# Patient Record
Sex: Female | Born: 1980 | Race: White | Hispanic: No | Smoking: Former smoker
Health system: Southern US, Community
[De-identification: ages and names within clinical notes are randomized; demographics above are authoritative.]

## PROBLEM LIST (undated history)

## (undated) ENCOUNTER — Inpatient Hospital Stay (HOSPITAL_COMMUNITY): Payer: Self-pay

## (undated) DIAGNOSIS — K219 Gastro-esophageal reflux disease without esophagitis: Secondary | ICD-10-CM

## (undated) DIAGNOSIS — Z87891 Personal history of nicotine dependence: Secondary | ICD-10-CM

---
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
## 2004-03-20 ENCOUNTER — Other Ambulatory Visit: Admission: RE | Admit: 2004-03-20 | Discharge: 2004-03-20 | Payer: Self-pay | Admitting: Obstetrics and Gynecology

## 2005-04-04 ENCOUNTER — Other Ambulatory Visit: Admission: RE | Admit: 2005-04-04 | Discharge: 2005-04-04 | Payer: Self-pay | Admitting: Obstetrics and Gynecology

## 2015-02-09 LAB — OB RESULTS CONSOLE GC/CHLAMYDIA
CHLAMYDIA, DNA PROBE: NEGATIVE
Gonorrhea: NEGATIVE

## 2015-02-09 LAB — OB RESULTS CONSOLE ABO/RH: RH Type: POSITIVE

## 2015-02-09 LAB — OB RESULTS CONSOLE HEPATITIS B SURFACE ANTIGEN: Hepatitis B Surface Ag: NEGATIVE

## 2015-02-09 LAB — OB RESULTS CONSOLE HIV ANTIBODY (ROUTINE TESTING): HIV: NONREACTIVE

## 2015-02-09 LAB — OB RESULTS CONSOLE RPR: RPR: NONREACTIVE

## 2015-02-09 LAB — OB RESULTS CONSOLE ANTIBODY SCREEN: Antibody Screen: NEGATIVE

## 2015-02-09 LAB — OB RESULTS CONSOLE RUBELLA ANTIBODY, IGM: Rubella: IMMUNE

## 2015-07-28 ENCOUNTER — Other Ambulatory Visit: Payer: Self-pay | Admitting: Obstetrics and Gynecology

## 2015-07-29 NOTE — Patient Instructions (Addendum)
Your procedure is scheduled on:  Tuesday, August 02, 2015  Enter through the Main Entrance of [facility] at: 6:00 am   Pick up the phone at the desk and dial [phone].  Call this number if you have problems the morning of surgery: [phone].  Remember: Do NOT eat food: AFTER MIDNIGHT TONIGHT Do NOT drink clear liquids after: after midnight tonight  Take these medicines the morning of surgery with a SIP OF WATER: PEPCID  Do NOT wear jewelry (body piercing), metal hair clips/bobby pins, or nail polish. Do NOT wear lotions, powders, or perfumes.  You may wear deoderant. Do NOT shave for 48 hours prior to surgery. Do NOT bring valuables to the hospital. Leave suitcase in car.  After surgery it may be brought to your room.  For patients admitted to the hospital, checkout time is 11:00 AM the day of discharge.

## 2015-08-01 ENCOUNTER — Encounter (HOSPITAL_COMMUNITY)
Admission: RE | Admit: 2015-08-01 | Discharge: 2015-08-01 | Disposition: A | Discharge status: Home / Self Care | Payer: BC Managed Care – PPO | Source: Ambulatory Visit | Attending: Obstetrics and Gynecology | Admitting: Obstetrics and Gynecology

## 2015-08-01 ENCOUNTER — Encounter (HOSPITAL_COMMUNITY): Payer: Self-pay

## 2015-08-01 HISTORY — DX: Gastro-esophageal reflux disease without esophagitis: K21.9

## 2015-08-01 LAB — CBC
HEMATOCRIT: 32.6 % — AB (ref 36.0–46.0)
Hemoglobin: 10.7 g/dL — ABNORMAL LOW (ref 12.0–15.0)
MCH: 30.1 pg (ref 26.0–34.0)
MCHC: 32.8 g/dL (ref 30.0–36.0)
MCV: 91.6 fL (ref 78.0–100.0)
PLATELETS: 150 10*3/uL (ref 150–400)
RBC: 3.56 MIL/uL — AB (ref 3.87–5.11)
RDW: 14.4 % (ref 11.5–15.5)
WBC: 9.4 10*3/uL (ref 4.0–10.5)

## 2015-08-01 LAB — ABO/RH: ABO/RH(D): O POS

## 2015-08-01 LAB — TYPE AND SCREEN
ABO/RH(D): O POS
Antibody Screen: NEGATIVE

## 2015-08-01 MED ORDER — DEXTROSE 5 % IV SOLN
3.0000 g | INTRAVENOUS | Status: AC
  Administered 2015-08-02: 3 g via INTRAVENOUS
  Filled 2015-08-01: qty 3000

## 2015-08-01 NOTE — Anesthesia Preprocedure Evaluation (Addendum)
Anesthesia Evaluation  Patient identified by MRN, date of birth, ID band Patient awake    Reviewed: Allergy & Precautions, NPO status , Patient's Chart, lab work & pertinent test results  History of Anesthesia Complications Negative for: history of anesthetic complications  Airway Mallampati: III  TM Distance: >3 FB Neck ROM: Full    Dental no notable dental hx. (+) Dental Advisory Given   Pulmonary former smoker,    Pulmonary exam normal breath sounds clear to auscultation       Cardiovascular negative cardio ROS Normal cardiovascular exam Rhythm:Regular Rate:Normal     Neuro/Psych negative neurological ROS  negative psych ROS   GI/Hepatic Neg liver ROS, GERD  Medicated and Controlled,  Endo/Other  Morbid obesity  Renal/GU negative Renal ROS  negative genitourinary   Musculoskeletal negative musculoskeletal ROS (+)   Abdominal (+) + obese,   Peds negative pediatric ROS (+)  Hematology negative hematology ROS (+)   Anesthesia Other Findings   Reproductive/Obstetrics (+) Pregnancy Multiple gestation                            Anesthesia Physical Anesthesia Plan  ASA: III  Anesthesia Plan: Combined Spinal and Epidural   Post-op Pain Management:    Induction:   Airway Management Planned:   Additional Equipment:   Intra-op Plan:   Post-operative Plan:   Informed Consent: I have reviewed the patients History and Physical, chart, labs and discussed the procedure including the risks, benefits and alternatives for the proposed anesthesia with the patient or authorized representative who has indicated his/her understanding and acceptance.   Dental advisory given  Plan Discussed with: CRNA  Anesthesia Plan Comments:        Anesthesia Quick Evaluation

## 2015-08-01 NOTE — Progress Notes (Signed)
34 y.o.  [redacted]w[redacted]d    G1P0 comes in for a cesarean section at term for malpresentation of twins.  Patient has good fetal movement x2 and no bleeding.   Past Medical History  Diagnosis Date  . GERD (gastroesophageal reflux disease)     due to pregnancy    No past surgical history on file.  OB History  Gravida Para Term Preterm AB SAB TAB Ectopic Multiple Living  1             # Outcome Date GA Lbr Len/2nd Weight Sex Delivery Anes PTL Lv  1 Current               Social History   Social History  . Marital Status: Married    Spouse Name: N/A  . Number of Children: N/A  . Years of Education: N/A   Occupational History  . Not on file.   Social History Main Topics  . Smoking status: Former Smoker -- 0.25 packs/day    Types: Cigarettes  . Smokeless tobacco: Never Used  . Alcohol Use: No  . Drug Use: No  . Sexual Activity: Not on file   Other Topics Concern  . Not on file   Social History Narrative  . No narrative on file   Review of patient's allergies indicates no known allergies.   Prenatal Course: uncomplicated   Prenatal Transfer Tool  Maternal Diabetes: No Genetic Screening: Normal Maternal Ultrasounds/Referrals: Normal Fetal Ultrasounds or other Referrals:  None Maternal Substance Abuse:  No Significant Maternal Medications:  None Significant Maternal Lab Results: None   Filed Vitals:   08/02/15 0620  BP: 147/94  Pulse: 117  Temp: 98.8 F (37.1 C)  TempSrc: Oral  SpO2: 99%    Lungs/Cor:  NAD Abdomen:  soft, gravid Ex:  no cords, erythema SVE:  NA FHTs:  Present x 2  [place] bedside confirms Br/Tr presentation.  A/P   For cesarean section at term for malpresentation of term.  All risks, benefits and alternatives discussed with patient and she desires to proceed.  Avrum Kimball A

## 2015-08-02 ENCOUNTER — Encounter (HOSPITAL_COMMUNITY): Payer: Self-pay | Admitting: *Deleted

## 2015-08-02 ENCOUNTER — Encounter (HOSPITAL_COMMUNITY)
Admission: RE | Disposition: A | Discharge status: Home / Self Care | Payer: Self-pay | Source: Ambulatory Visit | Attending: Obstetrics and Gynecology

## 2015-08-02 ENCOUNTER — Inpatient Hospital Stay (HOSPITAL_COMMUNITY): Payer: BC Managed Care – PPO | Admitting: Anesthesiology

## 2015-08-02 ENCOUNTER — Inpatient Hospital Stay (HOSPITAL_COMMUNITY)
Admission: RE | Admit: 2015-08-02 | Discharge: 2015-08-05 | DRG: 765 | Disposition: A | Discharge status: Home / Self Care | Payer: BC Managed Care – PPO | Source: Ambulatory Visit | Attending: Obstetrics and Gynecology | Admitting: Obstetrics and Gynecology

## 2015-08-02 DIAGNOSIS — O30043 Twin pregnancy, dichorionic/diamniotic, third trimester: Secondary | ICD-10-CM | POA: Diagnosis present

## 2015-08-02 DIAGNOSIS — O321XX1 Maternal care for breech presentation, fetus 1: Secondary | ICD-10-CM | POA: Diagnosis present

## 2015-08-02 DIAGNOSIS — O9081 Anemia of the puerperium: Secondary | ICD-10-CM | POA: Diagnosis not present

## 2015-08-02 DIAGNOSIS — Z9889 Other specified postprocedural states: Secondary | ICD-10-CM

## 2015-08-02 DIAGNOSIS — D62 Acute posthemorrhagic anemia: Secondary | ICD-10-CM | POA: Diagnosis not present

## 2015-08-02 DIAGNOSIS — Z3A37 37 weeks gestation of pregnancy: Secondary | ICD-10-CM

## 2015-08-02 DIAGNOSIS — Z3403 Encounter for supervision of normal first pregnancy, third trimester: Secondary | ICD-10-CM | POA: Diagnosis present

## 2015-08-02 HISTORY — DX: Personal history of nicotine dependence: Z87.891

## 2015-08-02 LAB — RPR: RPR Ser Ql: NONREACTIVE

## 2015-08-02 SURGERY — Surgical Case
Anesthesia: Choice

## 2015-08-02 MED ORDER — KETOROLAC TROMETHAMINE 30 MG/ML IJ SOLN: 30.0000 mg | Freq: Four times a day (QID) | INTRAMUSCULAR | Status: AC | PRN

## 2015-08-02 MED ORDER — FENTANYL CITRATE (PF) 100 MCG/2ML IJ SOLN
25.0000 ug | INTRAMUSCULAR | Status: DC | PRN
  Administered 2015-08-02 (×2): 50 ug via INTRAVENOUS

## 2015-08-02 MED ORDER — LANOLIN HYDROUS EX OINT: 1.0000 "application " | TOPICAL_OINTMENT | CUTANEOUS | Status: DC | PRN

## 2015-08-02 MED ORDER — FLEET ENEMA 7-19 GM/118ML RE ENEM: 1.0000 | ENEMA | Freq: Every day | RECTAL | Status: DC | PRN

## 2015-08-02 MED ORDER — OXYTOCIN 10 UNIT/ML IJ SOLN
INTRAMUSCULAR | Status: AC
  Filled 2015-08-02: qty 4

## 2015-08-02 MED ORDER — DIBUCAINE 1 % RE OINT: 1.0000 "application " | TOPICAL_OINTMENT | RECTAL | Status: DC | PRN

## 2015-08-02 MED ORDER — DIPHENHYDRAMINE HCL 50 MG/ML IJ SOLN: 12.5000 mg | INTRAMUSCULAR | Status: DC | PRN

## 2015-08-02 MED ORDER — ONDANSETRON HCL 4 MG/2ML IJ SOLN
INTRAMUSCULAR | Status: DC | PRN
  Administered 2015-08-02: 4 mg via INTRAVENOUS

## 2015-08-02 MED ORDER — NALBUPHINE HCL 10 MG/ML IJ SOLN: 5.0000 mg | INTRAMUSCULAR | Status: DC | PRN

## 2015-08-02 MED ORDER — BUPIVACAINE IN DEXTROSE 0.75-8.25 % IT SOLN
INTRATHECAL | Status: AC
Start: 2015-08-02 — End: 2015-08-02
  Filled 2015-08-02: qty 2

## 2015-08-02 MED ORDER — OXYTOCIN 10 UNIT/ML IJ SOLN
40.0000 [IU] | INTRAVENOUS | Status: DC | PRN
  Administered 2015-08-02: 40 [IU] via INTRAVENOUS

## 2015-08-02 MED ORDER — BUPIVACAINE IN DEXTROSE 0.75-8.25 % IT SOLN
INTRATHECAL | Status: DC | PRN
  Administered 2015-08-02: 1.6 mL via INTRATHECAL

## 2015-08-02 MED ORDER — FENTANYL CITRATE (PF) 100 MCG/2ML IJ SOLN
INTRAMUSCULAR | Status: AC
  Filled 2015-08-02: qty 4

## 2015-08-02 MED ORDER — MORPHINE SULFATE (PF) 0.5 MG/ML IJ SOLN
INTRAMUSCULAR | Status: DC | PRN
  Administered 2015-08-02: .2 mg via EPIDURAL

## 2015-08-02 MED ORDER — OXYCODONE-ACETAMINOPHEN 5-325 MG PO TABS
2.0000 | ORAL_TABLET | ORAL | Status: DC | PRN
  Administered 2015-08-03 – 2015-08-05 (×9): 2 via ORAL
  Filled 2015-08-02 (×8): qty 2

## 2015-08-02 MED ORDER — METHYLERGONOVINE MALEATE 0.2 MG PO TABS: 0.2000 mg | ORAL_TABLET | ORAL | Status: DC | PRN

## 2015-08-02 MED ORDER — IBUPROFEN 600 MG PO TABS
600.0000 mg | ORAL_TABLET | Freq: Four times a day (QID) | ORAL | Status: DC
  Administered 2015-08-02 – 2015-08-05 (×10): 600 mg via ORAL
  Filled 2015-08-02 (×11): qty 1

## 2015-08-02 MED ORDER — KETOROLAC TROMETHAMINE 30 MG/ML IJ SOLN
30.0000 mg | Freq: Four times a day (QID) | INTRAMUSCULAR | Status: AC | PRN
  Administered 2015-08-02 (×2): 30 mg via INTRAVENOUS
  Filled 2015-08-02 (×2): qty 1

## 2015-08-02 MED ORDER — FENTANYL CITRATE (PF) 100 MCG/2ML IJ SOLN
INTRAMUSCULAR | Status: AC
  Filled 2015-08-02: qty 2

## 2015-08-02 MED ORDER — FAMOTIDINE 20 MG PO TABS
20.0000 mg | ORAL_TABLET | Freq: Two times a day (BID) | ORAL | Status: DC
  Administered 2015-08-05: 20 mg via ORAL
  Filled 2015-08-02 (×4): qty 1

## 2015-08-02 MED ORDER — MEPERIDINE HCL 25 MG/ML IJ SOLN: 6.2500 mg | INTRAMUSCULAR | Status: DC | PRN

## 2015-08-02 MED ORDER — DIPHENHYDRAMINE HCL 25 MG PO CAPS: 25.0000 mg | ORAL_CAPSULE | ORAL | Status: DC | PRN

## 2015-08-02 MED ORDER — CEFAZOLIN SODIUM-DEXTROSE 2-3 GM-% IV SOLR
INTRAVENOUS | Status: AC
  Filled 2015-08-02: qty 50

## 2015-08-02 MED ORDER — DIPHENHYDRAMINE HCL 25 MG PO CAPS: 25.0000 mg | ORAL_CAPSULE | Freq: Four times a day (QID) | ORAL | Status: DC | PRN

## 2015-08-02 MED ORDER — MORPHINE SULFATE (PF) 0.5 MG/ML IJ SOLN
INTRAMUSCULAR | Status: AC
  Filled 2015-08-02: qty 100

## 2015-08-02 MED ORDER — SENNOSIDES-DOCUSATE SODIUM 8.6-50 MG PO TABS
2.0000 | ORAL_TABLET | ORAL | Status: DC
  Administered 2015-08-02 – 2015-08-05 (×3): 2 via ORAL
  Filled 2015-08-02 (×3): qty 2

## 2015-08-02 MED ORDER — NALOXONE HCL 0.4 MG/ML IJ SOLN: 0.4000 mg | INTRAMUSCULAR | Status: DC | PRN

## 2015-08-02 MED ORDER — SODIUM CHLORIDE 0.9 % IJ SOLN: 3.0000 mL | INTRAMUSCULAR | Status: DC | PRN

## 2015-08-02 MED ORDER — SODIUM CHLORIDE 0.9 % IR SOLN
Status: DC | PRN
  Administered 2015-08-02: 1000 mL

## 2015-08-02 MED ORDER — NALOXONE HCL 1 MG/ML IJ SOLN
1.0000 ug/kg/h | INTRAVENOUS | Status: DC | PRN
  Filled 2015-08-02: qty 2

## 2015-08-02 MED ORDER — BISACODYL 10 MG RE SUPP: 10.0000 mg | Freq: Every day | RECTAL | Status: DC | PRN

## 2015-08-02 MED ORDER — LACTATED RINGERS IV SOLN
INTRAVENOUS | Status: DC | PRN
  Administered 2015-08-02: 08:00:00 via INTRAVENOUS

## 2015-08-02 MED ORDER — SCOPOLAMINE 1 MG/3DAYS TD PT72
MEDICATED_PATCH | TRANSDERMAL | Status: AC
  Administered 2015-08-02: 1.5 mg via TRANSDERMAL
  Filled 2015-08-02: qty 1

## 2015-08-02 MED ORDER — INFLUENZA VAC SPLIT QUAD 0.5 ML IM SUSY
0.5000 mL | PREFILLED_SYRINGE | INTRAMUSCULAR | Status: DC
  Filled 2015-08-02: qty 0.5

## 2015-08-02 MED ORDER — LACTATED RINGERS IV SOLN
INTRAVENOUS | Status: DC
  Administered 2015-08-02: 18:00:00 via INTRAVENOUS

## 2015-08-02 MED ORDER — WITCH HAZEL-GLYCERIN EX PADS: 1.0000 "application " | MEDICATED_PAD | CUTANEOUS | Status: DC | PRN

## 2015-08-02 MED ORDER — ONDANSETRON HCL 4 MG/2ML IJ SOLN
INTRAMUSCULAR | Status: AC
  Filled 2015-08-02: qty 2

## 2015-08-02 MED ORDER — PHENYLEPHRINE 8 MG IN D5W 100 ML (0.08MG/ML) PREMIX OPTIME
INJECTION | INTRAVENOUS | Status: DC | PRN
  Administered 2015-08-02: 60 ug/min via INTRAVENOUS

## 2015-08-02 MED ORDER — PHENYLEPHRINE 8 MG IN D5W 100 ML (0.08MG/ML) PREMIX OPTIME
INJECTION | INTRAVENOUS | Status: AC
  Filled 2015-08-02: qty 100

## 2015-08-02 MED ORDER — ONDANSETRON HCL 4 MG/2ML IJ SOLN: 4.0000 mg | Freq: Three times a day (TID) | INTRAMUSCULAR | Status: DC | PRN

## 2015-08-02 MED ORDER — ONDANSETRON HCL 4 MG/2ML IJ SOLN: 4.0000 mg | Freq: Once | INTRAMUSCULAR | Status: DC | PRN

## 2015-08-02 MED ORDER — NALBUPHINE HCL 10 MG/ML IJ SOLN: 5.0000 mg | Freq: Once | INTRAMUSCULAR | Status: DC | PRN

## 2015-08-02 MED ORDER — FENTANYL CITRATE (PF) 100 MCG/2ML IJ SOLN
INTRAMUSCULAR | Status: DC | PRN
  Administered 2015-08-02: 10 ug via INTRAVENOUS

## 2015-08-02 MED ORDER — PRENATAL MULTIVITAMIN CH
1.0000 | ORAL_TABLET | Freq: Every day | ORAL | Status: DC
  Administered 2015-08-03 – 2015-08-04 (×2): 1 via ORAL
  Filled 2015-08-02 (×2): qty 1

## 2015-08-02 MED ORDER — ZOLPIDEM TARTRATE 5 MG PO TABS: 5.0000 mg | ORAL_TABLET | Freq: Every evening | ORAL | Status: DC | PRN

## 2015-08-02 MED ORDER — FERROUS SULFATE 325 (65 FE) MG PO TABS
325.0000 mg | ORAL_TABLET | Freq: Two times a day (BID) | ORAL | Status: DC
  Administered 2015-08-03 – 2015-08-05 (×5): 325 mg via ORAL
  Filled 2015-08-02 (×5): qty 1

## 2015-08-02 MED ORDER — ACETAMINOPHEN 325 MG PO TABS
650.0000 mg | ORAL_TABLET | ORAL | Status: DC | PRN
  Administered 2015-08-02: 650 mg via ORAL
  Filled 2015-08-02: qty 2

## 2015-08-02 MED ORDER — OXYTOCIN 40 UNITS IN LACTATED RINGERS INFUSION - SIMPLE MED: 62.5000 mL/h | INTRAVENOUS | Status: AC

## 2015-08-02 MED ORDER — SCOPOLAMINE 1 MG/3DAYS TD PT72
1.0000 | MEDICATED_PATCH | Freq: Once | TRANSDERMAL | Status: DC
Start: 2015-08-02 — End: 2015-08-02
  Administered 2015-08-02: 1.5 mg via TRANSDERMAL

## 2015-08-02 MED ORDER — SCOPOLAMINE 1 MG/3DAYS TD PT72
1.0000 | MEDICATED_PATCH | Freq: Once | TRANSDERMAL | Status: DC
  Filled 2015-08-02: qty 1

## 2015-08-02 MED ORDER — MENTHOL 3 MG MT LOZG: 1.0000 | LOZENGE | OROMUCOSAL | Status: DC | PRN

## 2015-08-02 MED ORDER — SIMETHICONE 80 MG PO CHEW: 80.0000 mg | CHEWABLE_TABLET | ORAL | Status: DC | PRN

## 2015-08-02 MED ORDER — SIMETHICONE 80 MG PO CHEW
80.0000 mg | CHEWABLE_TABLET | Freq: Three times a day (TID) | ORAL | Status: DC
  Administered 2015-08-02 – 2015-08-05 (×8): 80 mg via ORAL
  Filled 2015-08-02 (×9): qty 1

## 2015-08-02 MED ORDER — SIMETHICONE 80 MG PO CHEW
80.0000 mg | CHEWABLE_TABLET | ORAL | Status: DC
  Administered 2015-08-02 – 2015-08-05 (×3): 80 mg via ORAL
  Filled 2015-08-02 (×3): qty 1

## 2015-08-02 MED ORDER — METHYLERGONOVINE MALEATE 0.2 MG/ML IJ SOLN: 0.2000 mg | INTRAMUSCULAR | Status: DC | PRN

## 2015-08-02 MED ORDER — TETANUS-DIPHTH-ACELL PERTUSSIS 5-2.5-18.5 LF-MCG/0.5 IM SUSP: 0.5000 mL | Freq: Once | INTRAMUSCULAR | Status: DC

## 2015-08-02 MED ORDER — MEASLES, MUMPS & RUBELLA VAC ~~LOC~~ INJ
0.5000 mL | INJECTION | Freq: Once | SUBCUTANEOUS | Status: DC
  Filled 2015-08-02: qty 0.5

## 2015-08-02 MED ORDER — LACTATED RINGERS IV SOLN
INTRAVENOUS | Status: DC
  Administered 2015-08-02: 1000 mL/h via INTRAVENOUS
  Administered 2015-08-02 (×2): via INTRAVENOUS

## 2015-08-02 MED ORDER — OXYCODONE-ACETAMINOPHEN 5-325 MG PO TABS
1.0000 | ORAL_TABLET | ORAL | Status: DC | PRN
  Administered 2015-08-03 (×2): 1 via ORAL
  Filled 2015-08-02 (×4): qty 1

## 2015-08-02 SURGICAL SUPPLY — 42 items
APL SKNCLS STERI-STRIP NONHPOA (GAUZE/BANDAGES/DRESSINGS) ×1
BENZOIN TINCTURE PRP APPL 2/3 (GAUZE/BANDAGES/DRESSINGS) ×3 IMPLANT
CLAMP CORD UMBIL (MISCELLANEOUS) IMPLANT
CLOSURE WOUND 1/2 X4 (GAUZE/BANDAGES/DRESSINGS) ×1
CLOTH BEACON ORANGE TIMEOUT ST (SAFETY) ×3 IMPLANT
DRAPE SHEET LG 3/4 BI-LAMINATE (DRAPES) IMPLANT
DRSG OPSITE POSTOP 4X10 (GAUZE/BANDAGES/DRESSINGS) ×3 IMPLANT
DRSG PAD ABDOMINAL 8X10 ST (GAUZE/BANDAGES/DRESSINGS) ×2 IMPLANT
DURAPREP 26ML APPLICATOR (WOUND CARE) ×3 IMPLANT
ELECT REM PT RETURN 9FT ADLT (ELECTROSURGICAL) ×3
ELECTRODE REM PT RTRN 9FT ADLT (ELECTROSURGICAL) ×1 IMPLANT
EXTRACTOR VACUUM BELL STYLE (SUCTIONS) IMPLANT
GLOVE BIO SURGEON STRL SZ7 (GLOVE) ×3 IMPLANT
GOWN STRL REUS W/TWL LRG LVL3 (GOWN DISPOSABLE) ×6 IMPLANT
KIT ABG SYR 3ML LUER SLIP (SYRINGE) IMPLANT
NDL HYPO 18GX1.5 BLUNT FILL (NEEDLE) ×1 IMPLANT
NDL HYPO 25X5/8 SAFETYGLIDE (NEEDLE) IMPLANT
NDL SAFETY ECLIPSE 18X1.5 (NEEDLE) ×1 IMPLANT
NEEDLE HYPO 18GX1.5 BLUNT FILL (NEEDLE) ×3 IMPLANT
NEEDLE HYPO 18GX1.5 SHARP (NEEDLE) ×3
NEEDLE HYPO 25X5/8 SAFETYGLIDE (NEEDLE) IMPLANT
NS IRRIG 1000ML POUR BTL (IV SOLUTION) ×3 IMPLANT
PACK C SECTION WH (CUSTOM PROCEDURE TRAY) ×3 IMPLANT
PAD OB MATERNITY 4.3X12.25 (PERSONAL CARE ITEMS) ×3 IMPLANT
PENCIL SMOKE EVAC W/HOLSTER (ELECTROSURGICAL) ×3 IMPLANT
RETRACTOR TRAXI PANNICULUS (MISCELLANEOUS) IMPLANT
RTRCTR C-SECT PINK 25CM LRG (MISCELLANEOUS) ×3 IMPLANT
STRIP CLOSURE SKIN 1/2X4 (GAUZE/BANDAGES/DRESSINGS) ×2 IMPLANT
SUT MNCRL 0 VIOLET CTX 36 (SUTURE) ×2 IMPLANT
SUT MONOCRYL 0 CTX 36 (SUTURE) ×4
SUT PDS AB 0 CTX 60 (SUTURE) IMPLANT
SUT PLAIN 2 0 XLH (SUTURE) ×2 IMPLANT
SUT VIC AB 0 CT1 27 (SUTURE) ×6
SUT VIC AB 0 CT1 27XBRD ANBCTR (SUTURE) ×2 IMPLANT
SUT VIC AB 2-0 CT1 27 (SUTURE) ×6
SUT VIC AB 2-0 CT1 TAPERPNT 27 (SUTURE) ×1 IMPLANT
SUT VIC AB 4-0 KS 27 (SUTURE) ×3 IMPLANT
SYR BULB 3OZ (MISCELLANEOUS) ×3 IMPLANT
SYRINGE 10CC LL (SYRINGE) ×3 IMPLANT
TOWEL OR 17X24 6PK STRL BLUE (TOWEL DISPOSABLE) ×3 IMPLANT
TRAXI PANNICULUS RETRACTOR (MISCELLANEOUS) ×2
TRAY FOLEY CATH SILVER 14FR (SET/KITS/TRAYS/PACK) ×3 IMPLANT

## 2015-08-02 NOTE — Brief Op Note (Signed)
08/02/2015  8:30 AM  PATIENT:  Adriana Deleon  34 y.o. female  PRE-OPERATIVE DIAGNOSIS:  TWINS / BABY A BREECH EDD: 08/16/2015, NR HIV, NKDA  POST-OPERATIVE DIAGNOSIS:  TWINS / BABY A BREECH EDD: 08/16/2015, NR HIV, NKDA  PROCEDURE:  Procedure(s): CESAREAN SECTION MULTI-GESTATIONAL (N/A)  SURGEON:  Surgeon(s) and Role:    * Bobbye Charleston, MD - Primary    * Jerelyn Charles, MD - Assisting    ANESTHESIA:   spinal /CSE EBL:  Total I/O In: 3000 [I.V.:3000] Out: 900 [Urine:100; Blood:800]   SPECIMEN:  Source of Specimen:  placenta  DISPOSITION OF SPECIMEN:  PATHOLOGY  COUNTS:  YES  TOURNIQUET:  * No tourniquets in log *  DICTATION: .Note written in EPIC  PLAN OF CARE: Admit to inpatient   PATIENT DISPOSITION:  PACU - hemodynamically stable.   Delay start of Pharmacological VTE agent (>24hrs) due to surgical blood loss or risk of bleeding: not applicable  Complications:  None. Medications:  Ancef, Pitocin Findings:  Baby A female breech, Apgars 8,9, weight P.  Baby B female oblique/VTX, Apgars 9,9, weight P. Normal tubes, ovaries and uterus seen.  Babies were skin to skin with mother and father after birth in the [place].  Technique:  After adequate CSE anesthesia was achieved, the patient was prepped and draped in usual sterile fashion.  A foley catheter was used to drain the bladder.  A Traxi was placed correctly to expose the lower abdomen.  A pfannanstiel incision was made with the scalpel and carried down to the fascia with the bovie cautery. The fascia was incised in the midline with the scalpel and carried in a transverse curvilinear manner bilaterally.  The fascia was reflected superiorly and inferiorly off the rectus muscles and the muscles split in the midline.  A bowel free portion of the peritoneum was entered bluntly and then extended in a superior and inferior manner with good visualization of the bowel and bladder.  The Alexis instrument was then placed and the  vesico-uterine fascia tented up and incised in a transverse curvilinear manner.  A 2 cm transverse incision was made in the upper portion of the lower uterine segment until the amnion was exposed.   The incision was extended transversely in a blunt manner.  Clear fluid was noted and Baby A was delivered in the complete breech presentation without complication.  Baby A was bulb suctioned and the cord was clamped and cut.  The baby was then handed to awaiting Neonatology.  The second sac was ruptured and clear fluid noted.  Baby A's VTX was brought down and delivered VTX without complication.  Baby B was bulb suctioned, the cord was clamped and cut and the baby handed to awaiting Neo.  Both cord's were stripped of blood back to the fetus before clamping. The placenta was then delivered manually and the uterus cleared of all debris.  The uterine incision was then closed with a running lock stitch of 0 monocryl.  An imbricating layer of 0 monocryl was closed as well. Excellent hemostasis of the uterine incision was achieved and the abdomen was cleared with irrigation.  The peritoneum was closed with a running stitch of 2-0 vicryl.  This incorporated the rectus muscles as a separate layer.  The fascia was then closed with a running stitch of 0 vicryl.  The subcutaneous layer was closed with interrupted  stitches of 2-0 plain gut.  The skin was closed with 4-0 vicryl on a Keith needle and steri-strips.  The  patient tolerated the procedure well and was returned to the recovery room in stable condition.  All counts were correct times three.  Buffy Ehler A

## 2015-08-02 NOTE — Progress Notes (Signed)
There has been no change in the patients history, status or exam since the history and physical.  Filed Vitals:   08/02/15 0620  BP: 147/94  Pulse: 117  Temp: 98.8 F (37.1 C)  TempSrc: Oral  SpO2: 99%    Lab Results  Component Value Date   WBC 9.4 08/01/2015   HGB 10.7* 08/01/2015   HCT 32.6* 08/01/2015   MCV 91.6 08/01/2015   PLT 150 08/01/2015    Shivaan Tierno A

## 2015-08-02 NOTE — Anesthesia Procedure Notes (Signed)
Epidural Patient location during procedure: OR  Staffing Anesthesiologist: Sian Rockers Performed by: anesthesiologist   Preanesthetic Checklist Completed: patient identified, site marked, surgical consent, pre-op evaluation, timeout performed, IV checked, risks and benefits discussed and monitors and equipment checked  Epidural Patient position: sitting Prep: site prepped and draped and DuraPrep Patient monitoring: continuous pulse ox and blood pressure Approach: midline Location: L3-L4 Injection technique: LOR saline  Needle:  Needle type: Tuohy  Needle gauge: 17 G Needle length: 9 cm and 9 Needle insertion depth: 9 cm Catheter type: closed end flexible Catheter size: 19 Gauge Catheter at skin depth: 14 cm Test dose: negative  Assessment Events: blood not aspirated, injection not painful, no injection resistance, negative IV test and no paresthesia  Additional Notes Patient identified. Risks/Benefits/Options discussed with patient including but not limited to bleeding, infection, nerve damage, paralysis, failed block, incomplete pain control, headache, blood pressure changes, nausea, vomiting, reactions to medication both or allergic, itching and postpartum back pain. Confirmed with bedside nurse the patient's most recent platelet count. Confirmed with patient that they are not currently taking any anticoagulation, have any bleeding history or any family history of bleeding disorders. Patient expressed understanding and wished to proceed. All questions were answered. Sterile technique was used throughout the entire procedure. Please see nursing notes for vital signs. Test dose was given through epidural catheter and negative prior to continuing to dose epidural.   This was a CSE. After LOR, a 27ga sprotte needle was passed and clear CSF return. Spinal dose given and needle withdrawn. Epidural catheter passed easily and tuohy withdrawn.  Reason for block:surgical anesthesia

## 2015-08-02 NOTE — Consult Note (Signed)
Neonatology Note:   Attendance at C-section:    I was asked by Dr. Philis Pique to attend this primary C/S at 38 weeks due to twin gestation with malpresentation. The mother is a G1P0 O pos, GBS neg with concordant twins. ROM at delivery, fluid clear.  Twin A, a boy, delivered breech.  Infant vigorous with good spontaneous cry and tone. Needed bulb suctioning for clear secretions. He appeared pale for the first 2-3 minutes, then pinked up. Ap 8/9. Lungs clear to ausc in DR. To CN to care of Pediatrician.  Twin B, a girl, delivered vertex.  Infant vigorous with good spontaneous cry and tone. Needed only minimal bulb suctioning. Ap 9/9. Lungs with a few rales to ausc in DR, but no signs of distress. To CN to care of Pediatrician.   Real Cons, MD

## 2015-08-02 NOTE — Anesthesia Postprocedure Evaluation (Signed)
  Anesthesia Post-op Note  Patient: Adriana Deleon  Procedure(s) Performed: Procedure(s) (LRB): CESAREAN SECTION MULTI-GESTATIONAL (N/A)  Patient Location: PACU  Anesthesia Type: Epidural  Level of Consciousness: awake and alert   Airway and Oxygen Therapy: Patient Spontanous Breathing  Post-op Pain: mild  Post-op Assessment: Post-op Vital signs reviewed, Patient's Cardiovascular Status Stable, Respiratory Function Stable, Patent Airway and No signs of Nausea or vomiting  Last Vitals:  Filed Vitals:   08/02/15 1000  BP: 131/71  Pulse: 97  Temp: 37.1 C  Resp: 18    Post-op Vital Signs: stable   Complications: No apparent anesthesia complications

## 2015-08-02 NOTE — Op Note (Signed)
08/02/2015  8:30 AM  PATIENT:  Adriana Deleon  34 y.o. female  PRE-OPERATIVE DIAGNOSIS:  TWINS / BABY A BREECH EDD: 08/16/2015, NR HIV, NKDA  POST-OPERATIVE DIAGNOSIS:  TWINS / BABY A BREECH EDD: 08/16/2015, NR HIV, NKDA  PROCEDURE:  Procedure(s): CESAREAN SECTION MULTI-GESTATIONAL (N/A)  SURGEON:  Surgeon(s) and Role:    * Bobbye Charleston, MD - Primary    * Jerelyn Charles, MD - Assisting    ANESTHESIA:   spinal /CSE EBL:  Total I/O In: 3000 [I.V.:3000] Out: 900 [Urine:100; Blood:800]   SPECIMEN:  Source of Specimen:  placenta  DISPOSITION OF SPECIMEN:  PATHOLOGY  COUNTS:  YES  DICTATION: .Note written in EPIC  PLAN OF CARE: Admit to inpatient   PATIENT DISPOSITION:  PACU - hemodynamically stable.   Delay start of Pharmacological VTE agent (>24hrs) due to surgical blood loss or risk of bleeding: not applicable  Complications:  None. Medications:  Ancef 3 gms, Pitocin Findings:  Baby A female breech, Apgars 8,9, weight P.  Baby B female oblique/VTX, Apgars 9,9, weight P. Normal tubes, ovaries and uterus seen.  Babies were skin to skin with mother and father after birth in the [place].  Technique:  After adequate CSE anesthesia was achieved, the patient was prepped and draped in usual sterile fashion.  A foley catheter was used to drain the bladder.  A Traxi was placed correctly to expose the lower abdomen.  A pfannanstiel incision was made with the scalpel and carried down to the fascia with the bovie cautery. The fascia was incised in the midline with the scalpel and carried in a transverse curvilinear manner bilaterally.  The fascia was reflected superiorly and inferiorly off the rectus muscles and the muscles split in the midline.  A bowel free portion of the peritoneum was entered bluntly and then extended in a superior and inferior manner with good visualization of the bowel and bladder.  The Alexis instrument was then placed and the vesico-uterine fascia tented up and  incised in a transverse curvilinear manner.  A 2 cm transverse incision was made in the upper portion of the lower uterine segment until the amnion was exposed.   The incision was extended transversely in a blunt manner.  Clear fluid was noted and Baby A was delivered in the complete breech presentation without complication.  Baby A was bulb suctioned and the cord was clamped and cut.  The baby was then handed to awaiting Neonatology.  The second sac was ruptured and clear fluid noted.  Baby A's VTX was brought down and delivered VTX without complication.  Baby B was bulb suctioned, the cord was clamped and cut and the baby handed to awaiting Neo.  Both cord's were stripped of blood back to the fetus before clamping. The placenta was then delivered manually and the uterus cleared of all debris.  The uterine incision was then closed with a running lock stitch of 0 monocryl.  An imbricating layer of 0 monocryl was closed as well. Excellent hemostasis of the uterine incision was achieved and the abdomen was cleared with irrigation.  The peritoneum was closed with a running stitch of 2-0 vicryl.  This incorporated the rectus muscles as a separate layer.  The fascia was then closed with a running stitch of 0 vicryl.  The subcutaneous layer was closed with interrupted  stitches of 2-0 plain gut.  The skin was closed with 4-0 vicryl on a Keith needle and steri-strips.  The patient tolerated the procedure well and was  returned to the recovery room in stable condition.  All counts were correct times three.  Yui Mulvaney A

## 2015-08-02 NOTE — Lactation Note (Signed)
This note was copied from the chart of [place]. Lactation Consultation Note  Patient Name: Adriana Deleon Today's Date: 08/02/2015 Reason for consult: Initial assessment;Multiple gestation  Baby is 67 hours old, Breast fed x2 and one attempt.  Last fed at 1605 - 20 mins, latch score of 6 . 2 voids , 2 stools ,  Presently baby in dad's arms wrapped in a blanket sound asleep , not showing any feeding cues.  Mom and dad aware to call with feeding cues for feeding assistance and latch assessment. Mother informed of post-discharge support and given phone number to the lactation department, including  services for phone call assistance; out-patient appointments; and breastfeeding support group. List of other  breastfeeding resources in the community given in the handout. Encouraged mother to call for problems or concerns related to breastfeeding.   Maternal Data Has patient been taught Hand Expression?: Yes (showed mom while helping latch baby B ) Does the patient have breastfeeding experience prior to this delivery?: No  Feeding Feeding Type:  (no signs of hunger , perm om recently fed ) Length of feed: 20 min  LATCH Score/Interventions                      Lactation Tools Discussed/Used     Consult Status Consult Status: Follow-up Date: 08/02/15 Follow-up type: In-patient    Myer Haff 08/02/2015, 6:43 PM

## 2015-08-02 NOTE — Anesthesia Postprocedure Evaluation (Signed)
  Anesthesia Post-op Note  Patient: Adriana Deleon  Procedure(s) Performed: Procedure(s): CESAREAN SECTION MULTI-GESTATIONAL (N/A)  Patient Location: Mother/Baby  Anesthesia Type:Spinal  Level of Consciousness: awake, alert  and oriented  Airway and Oxygen Therapy: Patient Spontanous Breathing  Post-op Pain: none  Post-op Assessment: Post-op Vital signs reviewed, Patient's Cardiovascular Status Stable, Respiratory Function Stable, Patent Airway, No signs of Nausea or vomiting, Adequate PO intake, Pain level controlled, No headache, No backache and Patient able to bend at knees              Post-op Vital Signs: Reviewed and stable  Last Vitals:  Filed Vitals:   08/02/15 1335  BP: 116/72  Pulse: 75  Temp: 37.2 C  Resp: 20    Complications: No apparent anesthesia complications

## 2015-08-02 NOTE — Lactation Note (Addendum)
This note was copied from the chart of [place]. Lactation Consultation Note  Patient Name: Adriana Deleon [id number] Date: 08/02/2015 Reason for consult: Initial assessment;Multiple gestation  Baby is 85 hours old and has been to the breast x3 10 -30 mins ( last fed at 1500 for 30 mins) . 1 attempt.  Voids - x3 , and Stools x 2.  Baby showing feeding cues @ consult - LC offered to help latch - mom accepting.  Baby placed skin to skin ( modified per mom request not to unwrap completely ), baby awake , latched for 5- 6 strong sucks  And one swallow. ( prior to latch - hand expressed one large drop)  Baby released and fell back to sleep) . Baby secure next to mom.  Mom aware to call with feeding cues. And for Lactation assistance and latch check.  Mother informed of post-discharge support and given phone number to the lactation department, including services for phone  call assistance; out-patient appointments; and breastfeeding support group. List of other breastfeeding resources in the community  given in the handout. Encouraged mother to call for problems or concerns related to breastfeeding.     Maternal Data Has patient been taught Hand Expression?: Yes Does the patient have breastfeeding experience prior to this delivery?: No  Feeding Feeding Type: Breast Fed Length of feed: 2 min (5-6 strong sucks with one swallow noted )  LATCH Score/Interventions Latch: Repeated attempts needed to sustain latch, nipple held in mouth throughout feeding, stimulation needed to elicit sucking reflex. Intervention(s): Skin to skin;Teach feeding cues;Waking techniques Intervention(s): Adjust position;Assist with latch;Breast massage;Breast compression  Audible Swallowing: A few with stimulation  Type of Nipple: Everted at rest and after stimulation  Comfort (Breast/Nipple): Soft / non-tender     Hold (Positioning): Assistance needed to correctly position infant at breast and  maintain latch. Intervention(s): Breastfeeding basics reviewed;Support Pillows;Position options;Skin to skin  LATCH Score: 7  Lactation Tools Discussed/Used     Consult Status Consult Status: Follow-up Date: 08/02/15 Follow-up type: In-patient    Myer Haff 08/02/2015, 6:31 PM

## 2015-08-02 NOTE — Transfer of Care (Signed)
Immediate Anesthesia Transfer of Care Note  Patient: Adriana Deleon  Procedure(s) Performed: Procedure(s): [place] (N/A)  Patient Location: PACU  Anesthesia Type:Spinal  Level of Consciousness: awake, alert  and oriented  Airway & Oxygen Therapy: Patient Spontanous Breathing  Post-op Assessment: Report given to RN and Post -op Vital signs reviewed and stable  Post vital signs: Reviewed and stable  Last Vitals:  Filed Vitals:   08/02/15 0620  BP: 147/94  Pulse: 117  Temp: 35.5 C    Complications: No apparent anesthesia complications

## 2015-08-02 NOTE — Lactation Note (Signed)
This note was copied from the chart of [place]. Lactation Consultation Note  Patient Name: Adriana Deleon Today's Date: 08/02/2015  Set up DEBP and gave curved tip syringes. Mom was doing sts with the boy and the girl was getting her bath then FOB to sts. Mom feels likes the boy is latching well but the girls needs help. She will page at the next feeding for help with the girl. She will start pumping after their next feeding. She is aware of lactation services, O/P lactation, and support group.     Maternal Data    Feeding Feeding Type: Breast Fed Length of feed: 10 min  LATCH Score/Interventions                      Lactation Tools Discussed/Used     Consult Status      Adriana Deleon 08/02/2015, 11:04 PM

## 2015-08-02 NOTE — Addendum Note (Signed)
Addendum  created 08/02/15 1411 by Jonna Munro, CRNA   Modules edited: Notes Section   Notes Section:  File: [id number]

## 2015-08-03 ENCOUNTER — Encounter (HOSPITAL_COMMUNITY): Payer: Self-pay | Admitting: Obstetrics and Gynecology

## 2015-08-03 LAB — CBC
HCT: 21.1 % — ABNORMAL LOW (ref 36.0–46.0)
Hemoglobin: 7.2 g/dL — ABNORMAL LOW (ref 12.0–15.0)
MCH: 31.2 pg (ref 26.0–34.0)
MCHC: 34.1 g/dL (ref 30.0–36.0)
MCV: 91.3 fL (ref 78.0–100.0)
PLATELETS: 135 10*3/uL — AB (ref 150–400)
RBC: 2.31 MIL/uL — ABNORMAL LOW (ref 3.87–5.11)
RDW: 14.9 % (ref 11.5–15.5)
WBC: 9.8 10*3/uL (ref 4.0–10.5)

## 2015-08-03 NOTE — Progress Notes (Signed)
  Patient is eating, ambulating, voiding.  Pain control is good.  Filed Vitals:   08/02/15 1730 08/02/15 2122 08/03/15 0130 08/03/15 0611  BP: 119/64 112/49 129/66 115/68  Pulse: 73 74 74 86  Temp: 98.9 F (37.2 C) 98.7 F (37.1 C) 98.6 F (37 C) 99.1 F (37.3 C)  TempSrc: Oral Oral Oral Oral  Resp: 20 18 18 18   SpO2: 95%   99%    lungs:   clear to auscultation cor:    RRR Abdomen:  soft, appropriate tenderness, incisions intact and without erythema or exudate ex:    no cords   Lab Results  Component Value Date   WBC 9.8 08/03/2015   HGB 7.2* 08/03/2015   HCT 21.1* 08/03/2015   MCV 91.3 08/03/2015   PLT 135* 08/03/2015    --/--/O POS, O POS (10/17 1125)/RI  A/P    Post operative day 1.  Routine post op and postpartum care.  Expect d/c routine.  Percocet for pain control.  Parents desires circumsision.  All risks, benefits and alternatives discussed with the mother.

## 2015-08-03 NOTE — Lactation Note (Signed)
This note was copied from the chart of [place]. Lactation Consultation Note  Patient Name: Adriana Deleon Today's Date: 08/03/2015   Visited with Mom, babies at 75 hrs old.  Baby taking 15-22 ml formula by bottle.  Mom has returned to exclusively pumping and bottle feeding as was her original plan.  Reviewed recommended routine of every 3 hr pumping both for 15-20 minutes.  Mom stated she would start pumping more. Mom does not have a DEBP at home, recommended she call her insurance company about obtaining a pump.  Explained about our pump rental program available.  Encouraged skin to skin as much as she can.  Mom not feeling well, wanting to walk in hallway rather than talk to [facility] at present.  Reviewed with [employer] about cleaning pump parts, basin brought into room.   To follow up in am or earlier if needed.       Broadus John 08/03/2015, 12:22 PM

## 2015-08-04 NOTE — Lactation Note (Signed)
This note was copied from the chart of [place]. Lactation Consultation Note  Patient Name: Adriana Deleon [id number] Date: 08/04/2015  Mostly formula feeding mom that does want to pump. She has only used the DEBP 1 time today. She got enough to feed both babies "a little". Went over milk transition. She stated that she would start pumping every 3 hr. She has lactation information and will call as needed.    Maternal Data    Feeding Feeding Type: Formula Nipple Type: Slow - flow  LATCH Score/Interventions                      Lactation Tools Discussed/Used     Consult Status      Denzil Hughes 08/04/2015, 5:36 PM

## 2015-08-04 NOTE — Progress Notes (Signed)
  Patient is eating, ambulating, voiding.  Pain control is good.  Bleeding appropriate  Filed Vitals:   08/03/15 0130 08/03/15 0611 08/03/15 1847 08/04/15 0600  BP: 129/66 115/68 124/74 122/66  Pulse: 74 86 83 85  Temp: 98.6 F (37 C) 99.1 F (37.3 C) 98.6 F (37 C) 98.6 F (37 C)  TempSrc: Oral Oral Oral   Resp: 18 18 18 18   SpO2:  99%      NAD Abdomen:  soft, appropriate tenderness, incisions intact and without erythema or exudate ex:    Symmetric, no calf tenderness  Lab Results  Component Value Date   WBC 9.8 08/03/2015   HGB 7.2* 08/03/2015   HCT 21.1* 08/03/2015   MCV 91.3 08/03/2015   PLT 135* 08/03/2015    --/--/O POS, O POS (10/17 1125)/RI  A/P    [id number] POD#2 s/p primary c/s for twin pregnancy Doing well.  ABLA--will d/c w PO iron Plan d/c tomorrow.

## 2015-08-05 ENCOUNTER — Ambulatory Visit: Payer: Self-pay

## 2015-08-05 MED ORDER — OXYCODONE-ACETAMINOPHEN 5-325 MG PO TABS: 1.0000 | ORAL_TABLET | ORAL | Status: DC | PRN

## 2015-08-05 NOTE — Discharge Summary (Signed)
Obstetric Discharge Summary Reason for Admission: cesarean section Prenatal Procedures: ultrasound Intrapartum Procedures: breech extraction and cesarean: low cervical, transverse Postpartum Procedures: none Complications-Operative and Postpartum: none HEMOGLOBIN  Date Value Ref Range Status  08/03/2015 7.2* 12.0 - 15.0 g/dL Final    Comment:    DELTA CHECK NOTED REPEATED TO VERIFY    HCT  Date Value Ref Range Status  08/03/2015 21.1* 36.0 - 46.0 % Final    Physical Exam:  General: alert and cooperative Lochia: appropriate Uterine Fundus: firm Incision: healing well, no significant drainage DVT Evaluation: No evidence of DVT seen on physical exam.  Discharge Diagnoses: Term Pregnancy-delivered  Discharge Information: Date: 08/05/2015 Activity: pelvic rest Diet: routine Medications: PNV, Ibuprofen and Percocet Condition: stable Instructions: refer to practice specific booklet Discharge to: home Follow-up Information    Follow up with HORVATH,MICHELLE A, MD In 2 weeks.   Specialty:  Obstetrics and Gynecology   Contact information:   [place] [address] [phone]       Newborn Data:   [place], [place] [[id number]]  Live born female  Birth Weight: 6 lb 2.2 oz (2785 g) APGAR: 8, [place], [place] [[id number]]  Live born female  Birth Weight: 6 lb 8.6 oz (2965 g) APGAR: 9, 9  Home with mother.  Dorrian Doggett 08/05/2015, 10:09 AM

## 2015-08-05 NOTE — Lactation Note (Signed)
This note was copied from the chart of [place]. Lactation Consultation Note: Mother states she has a hand pump and plans to use every 2-3 hours after feeding infants. Mother states that baby boy is breastfeeding well. She states that baby girl has a shallow latch. Mother states that is continuing to work with infants on latch. She prefers to pump and bottle feed. Mother states that her insurance doesn't provide a DEBO, she was informed of available rental programs. Mother denies any concerns and states she will phone if needed. Reviewed treatment to prevent engorgement.   Patient Name: Adriana Deleon PXTGG'Y Date: 08/05/2015     Maternal Data    Feeding    LATCH Score/Interventions                      Lactation Tools Discussed/Used     Consult Status      Darla Lesches 08/05/2015, 5:16 PM

## 2015-08-10 NOTE — H&P (Signed)
34 y.o.  [redacted]w[redacted]d    G1P0 comes in for a cesarean section at term for malpresentation of twins.  Patient has good fetal movement x2 and no bleeding.   Past Medical History  Diagnosis Date  . GERD (gastroesophageal reflux disease)     due to pregnancy   . Former smoker     Past Surgical History  Procedure Laterality Date  . Cesarean section multi-gestational N/A 08/02/2015    Procedure: CESAREAN SECTION MULTI-GESTATIONAL;  Surgeon: Bobbye Charleston, MD;  Location: [facility];  Service: Obstetrics;  Laterality: N/A;    OB History  Gravida Para Term Preterm AB SAB TAB Ectopic Multiple Living  1 1 1      1 2     # Outcome Date GA Lbr Len/2nd Weight Sex Delivery Anes PTL Lv  1A Term 08/02/15 [redacted]w[redacted]d  2.785 kg (6 lb 2.2 oz) M CS-LTranv EPI,Spinal  Y  1B Term 08/02/15 [redacted]w[redacted]d  2.965 kg (6 lb 8.6 oz) F CS-LTranv Spinal,EPI  Y      Social History   Social History  . Marital Status: Married    Spouse Name: N/A  . Number of Children: N/A  . Years of Education: N/A   Occupational History  . Not on file.   Social History Main Topics  . Smoking status: Former Smoker -- 0.25 packs/day    Types: Cigarettes  . Smokeless tobacco: Never Used  . Alcohol Use: No  . Drug Use: No  . Sexual Activity: Not on file   Other Topics Concern  . Not on file   Social History Narrative   Review of patient's allergies indicates no known allergies.   Prenatal Course: uncomplicated   Prenatal Transfer Tool  Maternal Diabetes: No Genetic Screening: Normal Maternal Ultrasounds/Referrals: Normal Fetal Ultrasounds or other Referrals:  None Maternal Substance Abuse:  No Significant Maternal Medications:  None Significant Maternal Lab Results: None   Filed Vitals:   08/04/15 0600 08/04/15 1755 08/04/15 2057 08/05/15 0600  BP: 122/66 135/68  134/87  Pulse: 85 72  106  Temp: 98.6 F (37 C) 99.3 F (37.4 C) 98.8 F (37.1 C) 98.8 F (37.1 C)  TempSrc:  Oral Oral   Resp: 18 18  18   SpO2:         Lungs/Cor:  NAD Abdomen:  soft, gravid Ex:  no cords, erythema SVE:  NA FHTs:  Present x 2  [place] bedside confirms Br/Tr presentation.  A/P   For cesarean section at term for malpresentation of term.  All risks, benefits and alternatives discussed with patient and she desires to proceed.  [name] A

## 2016-12-14 ENCOUNTER — Ambulatory Visit (INDEPENDENT_AMBULATORY_CARE_PROVIDER_SITE_OTHER): Payer: BC Managed Care – PPO

## 2016-12-14 ENCOUNTER — Ambulatory Visit (INDEPENDENT_AMBULATORY_CARE_PROVIDER_SITE_OTHER): Payer: BC Managed Care – PPO | Admitting: Podiatry

## 2016-12-14 ENCOUNTER — Encounter: Payer: Self-pay | Admitting: Podiatry

## 2016-12-14 VITALS — BP 103/72 | HR 78 | Resp 16 | Ht 68.0 in | Wt 250.0 lb

## 2016-12-14 DIAGNOSIS — M79671 Pain in right foot: Secondary | ICD-10-CM

## 2016-12-14 DIAGNOSIS — M79672 Pain in left foot: Secondary | ICD-10-CM

## 2016-12-14 DIAGNOSIS — M722 Plantar fascial fibromatosis: Secondary | ICD-10-CM

## 2016-12-14 MED ORDER — TRIAMCINOLONE ACETONIDE 10 MG/ML IJ SUSP
10.0000 mg | Freq: Once | INTRAMUSCULAR | Status: AC
  Administered 2016-12-14: 10 mg

## 2016-12-14 MED ORDER — DICLOFENAC SODIUM 75 MG PO TBEC: 75.0000 mg | DELAYED_RELEASE_TABLET | Freq: Two times a day (BID) | ORAL | 2 refills | Status: DC

## 2016-12-14 NOTE — Progress Notes (Signed)
   Subjective:    Patient ID: Adriana Deleon, female    DOB: Jun 24, 1981, 36 y.o.   MRN: [id number]  HPI Chief Complaint  Patient presents with  . Foot Pain    Bilateral; Left foot-arch; Right foot; arch and bottom of heel; pt stated, "Hurts more in the morning or if sit for awhile then get up"; x1 yr      Review of Systems  All other systems reviewed and are negative.      Objective:   Physical Exam        Assessment & Plan:

## 2016-12-14 NOTE — Patient Instructions (Signed)

## 2016-12-16 NOTE — Progress Notes (Signed)
Subjective:     Patient ID: Adriana Deleon, female   DOB: [birth year], 36 y.o.   MRN: [id number]  HPI patient presents stating I'm having a lot of pain in my plantar heel and it's mostly in the outside and I do not remember specific injury but it's been going on for around a year   Review of Systems  All other systems reviewed and are negative.      Objective:   Physical Exam  Constitutional: She is oriented to person, place, and time.  Cardiovascular: Intact distal pulses.   Musculoskeletal: Normal range of motion.  Neurological: She is oriented to person, place, and time.  Skin: Skin is warm.  Nursing note and vitals reviewed.  neurovascular status intact muscle strength was adequate range of motion within normal limits with patient found to have exquisite discomfort plantar aspect right heel at the insertional point tendon into the calcaneus on the lateral side and central band. Patient's medial band appears okay right now and patient's noted to have good digital perfusion and is well oriented 3 with moderate depression of the arch     Assessment:     Lateral band plantar fasciitis at the insertion into the calcaneus    Plan:     H&P condition reviewed and careful lateral injection administered 3 mg Kenalog 5 mg Xylocaine and applied fascial brace and placed patient on diclofenac 75 mg twice a day. Instructed on physical therapy shoe gear modifications and reappoint to recheck again in 1 week  X-ray report indicates no indications of stress fracture advanced arthritis with small spur formation

## 2016-12-28 ENCOUNTER — Ambulatory Visit (INDEPENDENT_AMBULATORY_CARE_PROVIDER_SITE_OTHER): Payer: BC Managed Care – PPO | Admitting: Podiatry

## 2016-12-28 ENCOUNTER — Encounter: Payer: Self-pay | Admitting: Podiatry

## 2016-12-28 DIAGNOSIS — M722 Plantar fascial fibromatosis: Secondary | ICD-10-CM

## 2016-12-30 NOTE — Progress Notes (Signed)
Subjective:     Patient ID: Riannon Mukherjee, female   DOB: [birth year], 36 y.o.   MRN: [id number]  HPI patient presents stating her heel is feeling quite a bit better but she still feels like her arch is depressed. States that she's walking better with minimal discomfort   Review of Systems     Objective:   Physical Exam Neurovascular status intact with patient still having pain with deep palpation to the plantar medial and lateral fascia with mild fluid buildup    Assessment:     Fasciitis of the right heel with inflammation    Plan:     H&P conditions reviewed and recommended conservative care consisting of stretching exercises and long-term orthotics with patient's scanned today. Will be seen back when those are ready

## 2017-01-15 ENCOUNTER — Telehealth: Payer: Self-pay | Admitting: Podiatry

## 2017-01-15 NOTE — Telephone Encounter (Signed)
Needs appointment with Liliane Channel to [facility]

## 2018-04-18 ENCOUNTER — Emergency Department (HOSPITAL_COMMUNITY): Payer: BC Managed Care – PPO

## 2018-04-18 ENCOUNTER — Encounter (HOSPITAL_COMMUNITY): Payer: Self-pay | Admitting: Emergency Medicine

## 2018-04-18 ENCOUNTER — Emergency Department (HOSPITAL_COMMUNITY)
Admission: EM | Admit: 2018-04-18 | Discharge: 2018-04-18 | Disposition: A | Discharge status: Home / Self Care | Payer: BC Managed Care – PPO | Attending: Emergency Medicine | Admitting: Emergency Medicine

## 2018-04-18 DIAGNOSIS — W1830XA Fall on same level, unspecified, initial encounter: Secondary | ICD-10-CM | POA: Insufficient documentation

## 2018-04-18 DIAGNOSIS — Z87891 Personal history of nicotine dependence: Secondary | ICD-10-CM | POA: Diagnosis not present

## 2018-04-18 DIAGNOSIS — S8992XA Unspecified injury of left lower leg, initial encounter: Secondary | ICD-10-CM | POA: Diagnosis present

## 2018-04-18 DIAGNOSIS — Y9301 Activity, walking, marching and hiking: Secondary | ICD-10-CM | POA: Diagnosis not present

## 2018-04-18 DIAGNOSIS — Y999 Unspecified external cause status: Secondary | ICD-10-CM | POA: Diagnosis not present

## 2018-04-18 DIAGNOSIS — Y92009 Unspecified place in unspecified non-institutional (private) residence as the place of occurrence of the external cause: Secondary | ICD-10-CM | POA: Insufficient documentation

## 2018-04-18 DIAGNOSIS — M25562 Pain in left knee: Secondary | ICD-10-CM | POA: Diagnosis not present

## 2018-04-18 NOTE — Discharge Instructions (Signed)

## 2018-04-18 NOTE — ED Provider Notes (Signed)
[facility] DEPT Provider Note   CSN: [id number] Arrival date & time: 04/18/18  1434     History   Chief Complaint Chief Complaint  Patient presents with  . Knee Pain  . Knee Injury    HPI Adriana Deleon is a 37 y.o. female.  HPI   Patient is a 37 year old female with a history of GERD who presents the emergency department today to be evaluated for left knee pain that began suddenly after a fall prior to arrival.  Patient states she was walking to her house and she tripped over a baby gate falling onto her bilateral knees.  Reports pain and swelling to the left knee.  States pain has improved since onset and she now has no pain with ambulation.  She does have swelling to the left knee.  She has no other injuries from the fall and denies any head trauma or loss consciousness.  Patient is on any blood thinners.  No numbness or weakness to the lower extremities.  She has not tried taking the medication for her symptoms.  Denies any exacerbating or alleviating factors.  Past Medical History:  Diagnosis Date  . Former smoker   . GERD (gastroesophageal reflux disease)    due to pregnancy     Patient Active Problem List   Diagnosis Date Noted  . Postoperative state 08/02/2015    Past Surgical History:  Procedure Laterality Date  . CESAREAN SECTION MULTI-GESTATIONAL N/A 08/02/2015   Procedure: CESAREAN SECTION MULTI-GESTATIONAL;  Surgeon: Bobbye Charleston, MD;  Location: [facility];  Service: Obstetrics;  Laterality: N/A;     OB History    Gravida  1   Para  1   Term  1   Preterm      AB      Living  2     SAB      TAB      Ectopic      Multiple  1   Live Births  2            Home Medications    Prior to Admission medications   Medication Sig Start Date End Date Taking? Authorizing Provider  diclofenac (VOLTAREN) 75 MG EC tablet Take 1 tablet (75 mg total) by mouth 2 (two) times daily. 12/14/16   Wallene Huh, DPM     Family History No family history on file.  Social History Social History   Tobacco Use  . Smoking status: Former Smoker    Packs/day: 0.25    Types: Cigarettes  . Smokeless tobacco: Never Used  Substance Use Topics  . Alcohol use: No  . Drug use: No     Allergies   Patient has no known allergies.   Review of Systems Review of Systems  Constitutional: Negative for fever.  Musculoskeletal:       Left knee pain, left knee swelling  Skin: Negative for wound.  Neurological: Negative for weakness and numbness.     Physical Exam Updated Vital Signs BP 125/78 (BP Location: Left Arm)   Pulse 76   Temp 98 F (36.7 C) (Oral)   Resp 18   SpO2 100%   Physical Exam  Constitutional: She appears well-developed and well-nourished. No distress.  HENT:  Head: Normocephalic and atraumatic.  Eyes: Conjunctivae are normal.  Neck: Neck supple.  Cardiovascular: Normal rate.  Pulmonary/Chest: Effort normal.  Musculoskeletal:  Swelling to the left knee.  No erythema or warmth.  She has tenderness to the lateral joint  line and just inferior to the patella.  5/5 strength in the bilateral lower extremities.  Normal sensation.  Distal pulses intact.  No joint laxity.  Neurological: She is alert.  Skin: Skin is warm and dry.  Psychiatric: She has a normal mood and affect.  Nursing note and vitals reviewed.  ED Treatments / Results  Labs (all labs ordered are listed, but only abnormal results are displayed) Labs Reviewed - No data to display  EKG None  Radiology Dg Knee Complete 4 Views Left  Result Date: 04/18/2018 CLINICAL DATA:  Left knee pain since the patient fell over a baby gait today. Initial encounter. EXAM: LEFT KNEE - COMPLETE 4+ VIEW COMPARISON:  None. FINDINGS: No evidence of fracture, dislocation, or joint effusion. No evidence of arthropathy or other focal bone abnormality. Soft tissues are unremarkable. IMPRESSION: Negative exam. Electronically Signed   By:  Inge Rise M.D.   On: 04/18/2018 16:10    Procedures Procedures (including critical care time)  Medications Ordered in ED Medications - No data to display   Initial Impression / Assessment and Plan / ED Course  I have reviewed the triage vital signs and the nursing notes.  Pertinent labs & imaging results that were available during my care of the patient were reviewed by me and considered in my medical decision making (see chart for details).     Final Clinical Impressions(s) / ED Diagnoses   Final diagnoses:  Acute pain of left knee   Patient presents emergency department complaining of left knee pain after she fell.  Vital signs stable.  Afebrile.  X-ray of the left knee shows no fractures or abnormalities.  No joint effusion.  Patient is ambulatory in the ED.  She is in no distress.  Will give knee sleeve and discharge patient.  She declined knee immobilizer and crutches.  Advised Tylenol, ibuprofen and rice protocol.  Advised to follow-up with PCP and to return to the ER if any new or worsening symptoms develop.  ED Discharge Orders    None       Bishop Dublin 04/18/18 1733    Quintella Reichert, MD 04/19/18 2040

## 2018-04-18 NOTE — ED Notes (Signed)
Bed: [facility] Expected date:  Expected time:  Means of arrival:  Comments: 

## 2018-04-18 NOTE — ED Triage Notes (Signed)
Patient here from home with complaints of left knee pain. Reports that she fell over a baby gate. Swelling and redness to left knee.

## 2018-04-28 ENCOUNTER — Encounter: Payer: Self-pay | Admitting: Family Medicine

## 2018-04-28 ENCOUNTER — Ambulatory Visit (INDEPENDENT_AMBULATORY_CARE_PROVIDER_SITE_OTHER): Payer: BC Managed Care – PPO | Admitting: Family Medicine

## 2018-04-28 VITALS — BP 100/80 | HR 68 | Temp 98.1°F | Ht 67.5 in | Wt 243.0 lb

## 2018-04-28 DIAGNOSIS — Z0001 Encounter for general adult medical examination with abnormal findings: Secondary | ICD-10-CM | POA: Diagnosis not present

## 2018-04-28 DIAGNOSIS — M25561 Pain in right knee: Secondary | ICD-10-CM

## 2018-04-28 DIAGNOSIS — Z1389 Encounter for screening for other disorder: Secondary | ICD-10-CM | POA: Diagnosis not present

## 2018-04-28 DIAGNOSIS — Z0000 Encounter for general adult medical examination without abnormal findings: Secondary | ICD-10-CM | POA: Diagnosis not present

## 2018-04-28 LAB — COMPREHENSIVE METABOLIC PANEL
ALBUMIN: 4.2 g/dL (ref 3.5–5.2)
ALT: 12 U/L (ref 0–35)
AST: 13 U/L (ref 0–37)
Alkaline Phosphatase: 61 U/L (ref 39–117)
BUN: 10 mg/dL (ref 6–23)
CO2: 28 mEq/L (ref 19–32)
Calcium: 8.9 mg/dL (ref 8.4–10.5)
Chloride: 106 mEq/L (ref 96–112)
Creatinine, Ser: 0.64 mg/dL (ref 0.40–1.20)
GFR: 112.44 mL/min (ref >60.00)
Glucose, Bld: 90 mg/dL (ref 70–99)
POTASSIUM: 4 meq/L (ref 3.5–5.1)
SODIUM: 141 meq/L (ref 135–145)
Total Bilirubin: 0.7 mg/dL (ref 0.2–1.2)
Total Protein: 6.7 g/dL (ref 6.0–8.3)

## 2018-04-28 LAB — CBC WITH DIFFERENTIAL/PLATELET
Basophils Absolute: 0 10*3/uL (ref 0.0–0.1)
Basophils Relative: 0.6 % (ref 0.0–3.0)
EOS PCT: 1.3 % (ref 0.0–5.0)
Eosinophils Absolute: 0.1 10*3/uL (ref 0.0–0.7)
HEMATOCRIT: 37.5 % (ref 36.0–46.0)
HEMOGLOBIN: 12.8 g/dL (ref 12.0–15.0)
Lymphocytes Relative: 30.7 % (ref 12.0–46.0)
Lymphs Abs: 1.6 10*3/uL (ref 0.7–4.0)
MCHC: 34 g/dL (ref 30.0–36.0)
MCV: 94.7 fl (ref 78.0–100.0)
MONO ABS: 0.4 10*3/uL (ref 0.1–1.0)
Monocytes Relative: 6.9 % (ref 3.0–12.0)
NEUTROS ABS: 3.2 10*3/uL (ref 1.4–7.7)
Neutrophils Relative %: 60.5 % (ref 43.0–77.0)
PLATELETS: 177 10*3/uL (ref 150.0–400.0)
RBC: 3.96 Mil/uL (ref 3.87–5.11)
RDW: 13.4 % (ref 11.5–15.5)
WBC: 5.3 10*3/uL (ref 4.0–10.5)

## 2018-04-28 LAB — LIPID PANEL
CHOL/HDL RATIO: 3
CHOLESTEROL: 158 mg/dL (ref 0–200)
HDL: 58.6 mg/dL (ref >39.00)
LDL CALC: 77 mg/dL (ref 0–99)
NonHDL: 99.13
Triglycerides: 109 mg/dL (ref 0.0–149.0)
VLDL: 21.8 mg/dL (ref 0.0–40.0)

## 2018-04-28 LAB — TSH
TSH: 1.36 (ref <=5.90)
TSH: 1.36 u[IU]/mL (ref 0.35–4.50)

## 2018-04-28 MED ORDER — DICLOFENAC SODIUM 1 % TD GEL: 2.0000 g | Freq: Four times a day (QID) | TRANSDERMAL | 1 refills | Status: DC

## 2018-04-28 NOTE — Progress Notes (Signed)
Patient: Adriana Deleon MRN: [id number] DOB: 04-28-1981 PCP: Orma Flaming, MD     Subjective:  Chief Complaint  Patient presents with  . Establish Care    cpe  . follow up L knee injury    HPI: The patient is a 37 y.o. female who presents today for annual exam. She denies any changes to past medical history. There have been no recent hospitalizations. They are not following a well balanced diet and exercise plan. Weight has been stable. She is interested in meeting with nutritionist to see about diet. She was 220 pounds   She went to ER on 04/18/2018 after she fell and tripped on a baby gate and fell directly onto her left knee. Xray in ER was normal and she states it's getting better everyday. No pain with weight bearing. She states it is sometimes stiff with flexion, but she has full range of motion. She has not gotten back into running. She has been walking and lifting weights.   Immunization History  Administered Date(s) Administered  . Tdap 05/30/2015    Pap smear: 05/2018 due for this. No abnormal pap smears.  hiv screen: done    Review of Systems  Constitutional: Negative for activity change, chills, fatigue and fever.  HENT: Negative for dental problem, ear pain, hearing loss and trouble swallowing.   Eyes: Negative for visual disturbance.  Respiratory: Negative for cough, chest tightness and shortness of breath.   Cardiovascular: Negative for chest pain, palpitations and leg swelling.  Gastrointestinal: Negative for abdominal pain, blood in stool, diarrhea, nausea and vomiting.  Endocrine: Negative for cold intolerance, polydipsia, polyphagia and polyuria.  Genitourinary: Negative for dysuria and hematuria.  Musculoskeletal: Negative for arthralgias.  Skin: Negative.  Negative for rash.  Allergic/Immunologic: Negative for food allergies.  Neurological: Negative for dizziness and headaches.  Psychiatric/Behavioral: Negative for dysphoric mood and sleep disturbance. The  patient is nervous/anxious.     Allergies Patient has No Known Allergies.  Past Medical History Patient  has a past medical history of Former smoker and GERD (gastroesophageal reflux disease).  Surgical History Patient  has a past surgical history that includes Cesarean section multi-gestational (N/A, 08/02/2015) and Cesarean section (08/02/2015).  Family History Pateint's family history includes Arthritis in her paternal grandfather and paternal grandmother; Asthma in her mother; COPD in her mother; Cancer in her maternal grandfather, maternal grandmother, mother, paternal grandfather, and paternal grandmother; Depression in her mother; Diabetes in her maternal grandfather; Hearing loss in her paternal grandmother; Hyperlipidemia in her brother, father, maternal grandmother, mother, and paternal grandfather; Hypertension in her brother, father, maternal grandmother, mother, paternal grandfather, and paternal grandmother.  Social History Patient  reports that she has quit smoking. Her smoking use included cigarettes. She smoked 0.25 packs per day. She has never used smokeless tobacco. She reports that she does not drink alcohol or use drugs.    Objective: Vitals:   04/28/18 1336  BP: 100/80  Pulse: 68  Temp: 98.1 F (36.7 C)  TempSrc: Oral  SpO2: 98%  Weight: 243 lb (110.2 kg)  Height: 5' 7.5" (1.715 m)    Body mass index is 37.5 kg/m.  Physical Exam Physical Exam  Constitutional: She is oriented to person, place, and time. She appears well-developed and well-nourished.  HENT:  Right Ear: External ear normal.  Left Ear: External ear normal.  Mouth/Throat: Oropharynx is clear and moist. No oropharyngeal exudate.  TM pearly with light reflex bilaterally   Eyes: Pupils are equal, round, and reactive to light. Conjunctivae  are normal.  Neck: Normal range of motion. Neck supple. No thyromegaly present.  Cardiovascular: Normal rate, regular rhythm, normal heart sounds and intact  distal pulses.  No murmur heard. Pulmonary/Chest: Effort normal and breath sounds normal. No respiratory distress. She has no wheezes.  Abdominal: Soft. Bowel sounds are normal. She exhibits no distension. There is no tenderness.  Musculoskeletal: Normal range of motion. She exhibits tenderness.  TTP over her left lateral-superior aspect of her patella. Negative draw signs, negative mcmurray, no crepitus. Full flexion/extension. No pain with varus/valgus strain. + weight bearing.   Lymphadenopathy:    She has no cervical adenopathy.  Neurological: She is alert and oriented to person, place, and time. She has normal reflexes. No cranial nerve deficit.  Skin: Skin is warm and dry.  Psychiatric: She has a normal mood and affect. Her behavior is normal.  Vitals reviewed.     GAD 7 : Generalized Anxiety Score 04/28/2018  Nervous, Anxious, on Edge 1  Control/stop worrying 1  Worry too much - different things 1  Trouble relaxing 0  Restless 0  Easily annoyed or irritable 1  Afraid - awful might happen 1  Total GAD 7 Score 5  Anxiety Difficulty Somewhat difficult        Assessment/plan: 1. Annual physical exam Doing great. Continue running/weight loss and will send to nutritionist for follow up. Sun protection discussed. F/u in one year or as needed. Scheduling pap with gyn Patient counseling [x]    Nutrition: Stressed importance of moderation in sodium/caffeine intake, saturated fat and cholesterol, caloric balance, sufficient intake of fresh fruits, vegetables, fiber, calcium, iron, and 1 mg of folate supplement per day (for females capable of pregnancy).  [x]    Stressed the importance of regular exercise.   []    Substance Abuse: Discussed cessation/primary prevention of tobacco, alcohol, or other drug use; driving or other dangerous activities under the influence; availability of treatment for abuse.   [x]    Injury prevention: Discussed safety belts, safety helmets, smoke detector,  smoking near bedding or upholstery.   [x]    Sexuality: Discussed sexually transmitted diseases, partner selection, use of condoms, avoidance of unintended pregnancy  and contraceptive alternatives.  [x]    Dental health: Discussed importance of regular tooth brushing, flossing, and dental visits.  [x]    Health maintenance and immunizations reviewed. Please refer to Health maintenance section.    - Comprehensive metabolic panel - CBC with Differential/Platelet - Lipid panel - TSH   2) right knee pain -doing better daily. Will do voltaren gel prn so she doesn't have to take pills. Can start to run if she is having no pain and knee feels okay. Let me know if not getting better or feels like getting worse, but likely bad bruise/sprain.    Return in about 1 year (around 04/29/2019).     Orma Flaming, MD [place]  04/28/2018

## 2018-04-28 NOTE — Progress Notes (Deleted)
Patient: Adriana Deleon MRN: [id number] DOB: 26-Nov-1980 PCP: Orma Flaming, MD     Subjective:  Chief Complaint  Patient presents with  . Establish Care    cpe  . follow up L knee injury    HPI: The patient is a 37 y.o. female who presents today for annual exam. {He/she (caps):30048} denies any changes to past medical history. There have been no recent hospitalizations. They {Actions; are/are not:16769} following a well balanced diet and exercise plan. Weight has been {trend:16658}. No complaints today.   Immunization History  Administered Date(s) Administered  . Tdap 05/30/2015   Colonoscopy: Mammogram:  Pap smear:  PSA:   Review of Systems  Constitutional: Negative for activity change and fatigue.  Respiratory: Negative for shortness of breath.   Cardiovascular: Negative for chest pain.  Gastrointestinal: Negative for abdominal pain, nausea and vomiting.  Musculoskeletal: Negative for arthralgias.  Skin: Negative.   Neurological: Negative for dizziness and headaches.  Psychiatric/Behavioral: The patient is nervous/anxious.     Allergies Patient has No Known Allergies.  Past Medical History Patient  has a past medical history of Former smoker and GERD (gastroesophageal reflux disease).  Surgical History Patient  has a past surgical history that includes Cesarean section multi-gestational (N/A, 08/02/2015) and Cesarean section (08/02/2015).  Family History Pateint's family history is not on file.  Social History Patient  reports that she has quit smoking. Her smoking use included cigarettes. She smoked 0.25 packs per day. She has never used smokeless tobacco. She reports that she does not drink alcohol or use drugs.    Objective: Vitals:   04/28/18 1336  BP: 100/80  Pulse: 68  Temp: 98.1 F (36.7 C)  TempSrc: Oral  SpO2: 98%  Weight: 243 lb (110.2 kg)  Height: 5' 7.5" (1.715 m)    Body mass index is 37.5 kg/m.  Physical Exam      Assessment/plan:   No problem-specific Assessment & Plan notes found for this encounter.    No follow-ups on file.     Orma Flaming, MD [place]  04/28/2018

## 2018-04-28 NOTE — Patient Instructions (Signed)
Health Maintenance Due  Topic Date Due  . PAP SMEAR  08/17/2004

## 2018-04-29 ENCOUNTER — Ambulatory Visit: Payer: BC Managed Care – PPO | Admitting: Physician Assistant

## 2018-04-29 ENCOUNTER — Encounter: Payer: Self-pay | Admitting: Physician Assistant

## 2018-04-29 VITALS — BP 102/70 | HR 69 | Temp 98.7°F | Ht 67.5 in | Wt 242.2 lb

## 2018-04-29 DIAGNOSIS — Z713 Dietary counseling and surveillance: Secondary | ICD-10-CM

## 2018-04-29 DIAGNOSIS — E669 Obesity, unspecified: Secondary | ICD-10-CM | POA: Diagnosis not present

## 2018-04-29 NOTE — Progress Notes (Signed)
Adriana Deleon is a 37 y.o. female here for Nutrition Consult  I acted as a Education administrator for [employer], PA-C Anselmo Pickler, LPN  History of Present Illness:   Chief Complaint  Patient presents with  . Nutrition Counseling    HPI  Patient is here to discuss Nutrition and Diet. Pt would like to get under 200 pounds for 1st goal. Pt's highest weight was 295 lbs  6 years ago, was down to 270 lbs at Christmas and now is at 242 lbs 4 oz. Pt is exercising everyday 30 minutes a day, walking, jogging and weights.   Dietary recall: Breakfast -- usually skips, creamer with coffee Lunch -- [employer] Dinner -- meat, veggie, starch Snacks -- crackers, almonds, baked chips Beverages -- water  Weight: Wt Readings from Last 3 Encounters:  04/29/18 242 lb 4 oz (109.9 kg)  04/28/18 243 lb (110.2 kg)  12/14/16 250 lb (113.4 kg)    Exercise: Walk/jog 11 min mile 3 x week  Support system: Family and friends  Goals: 1-recheck healthy weight 2-create a sustainable relationship with food and weight loss 3-make sure my family and I are balanced eaters  Estimated daily energy needs: Calories: 1600-1750 kcal Protein: 70-85 g Fluid: 2000 ml  Past Medical History:  Diagnosis Date  . Former smoker   . GERD (gastroesophageal reflux disease)    due to pregnancy      Social History   Socioeconomic History  . Marital status: Married    Spouse name: Not on file  . Number of children: Not on file  . Years of education: Not on file  . Highest education level: Not on file  Occupational History  . Not on file  Social Needs  . Financial resource strain: Not on file  . Food insecurity:    Worry: Not on file    Inability: Not on file  . Transportation needs:    Medical: Not on file    Non-medical: Not on file  Tobacco Use  . Smoking status: Former Smoker    Packs/day: 0.25    Types: Cigarettes  . Smokeless tobacco: Never Used  Substance and Sexual Activity  . Alcohol use: No  .  Drug use: No  . Sexual activity: Yes    Birth control/protection: Condom  Lifestyle  . Physical activity:    Days per week: Not on file    Minutes per session: Not on file  . Stress: Not on file  Relationships  . Social connections:    Talks on phone: Not on file    Gets together: Not on file    Attends religious service: Not on file    Active member of club or organization: Not on file    Attends meetings of clubs or organizations: Not on file    Relationship status: Not on file  . Intimate partner violence:    Fear of current or ex partner: Not on file    Emotionally abused: Not on file    Physically abused: Not on file    Forced sexual activity: Not on file  Other Topics Concern  . Not on file  Social History Narrative  . Not on file    Past Surgical History:  Procedure Laterality Date  . CESAREAN SECTION  08/02/2015  . CESAREAN SECTION MULTI-GESTATIONAL N/A 08/02/2015   Procedure: CESAREAN SECTION MULTI-GESTATIONAL;  Surgeon: Bobbye Charleston, MD;  Location: [facility];  Service: Obstetrics;  Laterality: N/A;    Family History  Problem Relation Age of  Onset  . Asthma Mother   . Cancer Mother   . COPD Mother   . Depression Mother   . Hyperlipidemia Mother   . Hypertension Mother   . Hyperlipidemia Father   . Hypertension Father   . Hyperlipidemia Brother   . Hypertension Brother   . Cancer Maternal Grandmother   . Hyperlipidemia Maternal Grandmother   . Hypertension Maternal Grandmother   . Cancer Maternal Grandfather   . Diabetes Maternal Grandfather   . Cancer Paternal Grandmother   . Hypertension Paternal Grandmother   . Hearing loss Paternal Grandmother   . Arthritis Paternal Grandmother   . Cancer Paternal Grandfather   . Hypertension Paternal Grandfather   . Hyperlipidemia Paternal Grandfather   . Arthritis Paternal Grandfather     No Known Allergies  Current Medications:   Current Outpatient Medications:  .  diclofenac sodium (VOLTAREN) 1 % GEL,  Apply 2 g topically 4 (four) times daily., Disp: 100 g, Rfl: 1   Review of Systems:   ROS  Negative unless otherwise specified per HPI.  Vitals:   Vitals:   04/29/18 1423  BP: 102/70  Pulse: 69  Temp: 98.7 F (37.1 C)  TempSrc: Oral  SpO2: 97%  Weight: 242 lb 4 oz (109.9 kg)  Height: 5' 7.5" (1.715 m)     Body mass index is 37.38 kg/m.  Physical Exam:   Physical Exam  Constitutional: She is oriented to person, place, and time. She appears well-developed and well-nourished.  HENT:  Head: Normocephalic and atraumatic.  Eyes: Conjunctivae and EOM are normal.  Neck: Normal range of motion. Neck supple.  Pulmonary/Chest: Effort normal.  Musculoskeletal: Normal range of motion.  Neurological: She is alert and oriented to person, place, and time.  Skin: Skin is warm and dry.  Psychiatric: She has a normal mood and affect. Her behavior is normal. Judgment and thought content normal.    Assessment and Plan:    Adriana Deleon was seen today for nutrition counseling.  Diagnoses and all orders for this visit:  Obesity, unspecified classification, unspecified obesity type, unspecified whether serious comorbidity present; Encounter for nutritional counseling  Discussed balancing meals and snacks.  Provided specific recommendations regarding meals and snacks.  Provided balance plate, 1800-calorie meal plan, balance snack and balance breakfast handouts. Follow-up prn. Patient agreeable to plan.   . Reviewed expectations re: course of current medical issues. . Discussed self-management of symptoms. . Outlined signs and symptoms indicating need for more acute intervention. . Patient verbalized understanding and all questions were answered. . See orders for this visit as documented in the electronic medical record. . Patient received an After-Visit Summary.  CMA or LPN served as scribe during this visit. History, Physical, and Plan performed by medical provider. Documentation and orders  reviewed and attested to.   I spent 25 minutes with this patient, greater than 50% was face-to-face time counseling regarding the above diagnoses.   Inda Coke, PA-C

## 2018-10-16 ENCOUNTER — Telehealth: Payer: Self-pay | Admitting: Family Medicine

## 2018-10-16 NOTE — Telephone Encounter (Signed)
Copied from [place] [phone]. Topic: General - Other >> Oct 16, 2018  2:10 PM Keene Breath wrote: Reason for CRM: Patient called to request her a copy of her immunization records for school.  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  Please advise and call patient when it is available for her to pick up.  CB# [phone]

## 2018-10-22 NOTE — Telephone Encounter (Signed)
Called and left detailed message on patient's cell phone.  Copy of immunization record will be copied and left at front desk to p/u at patient's convenience.  The only immunization that we have record of pt r/c here at our office is a Tdap.

## 2018-11-08 IMAGING — CR DG KNEE COMPLETE 4+V*L*
4 series · 4 of 4 positions shown · non-contrast
Comparison: None.

CLINICAL DATA: Left knee pain since the patient fell over a baby
gait today. Initial encounter.

EXAM:
LEFT KNEE - COMPLETE 4+ VIEW

[t knee ap left]
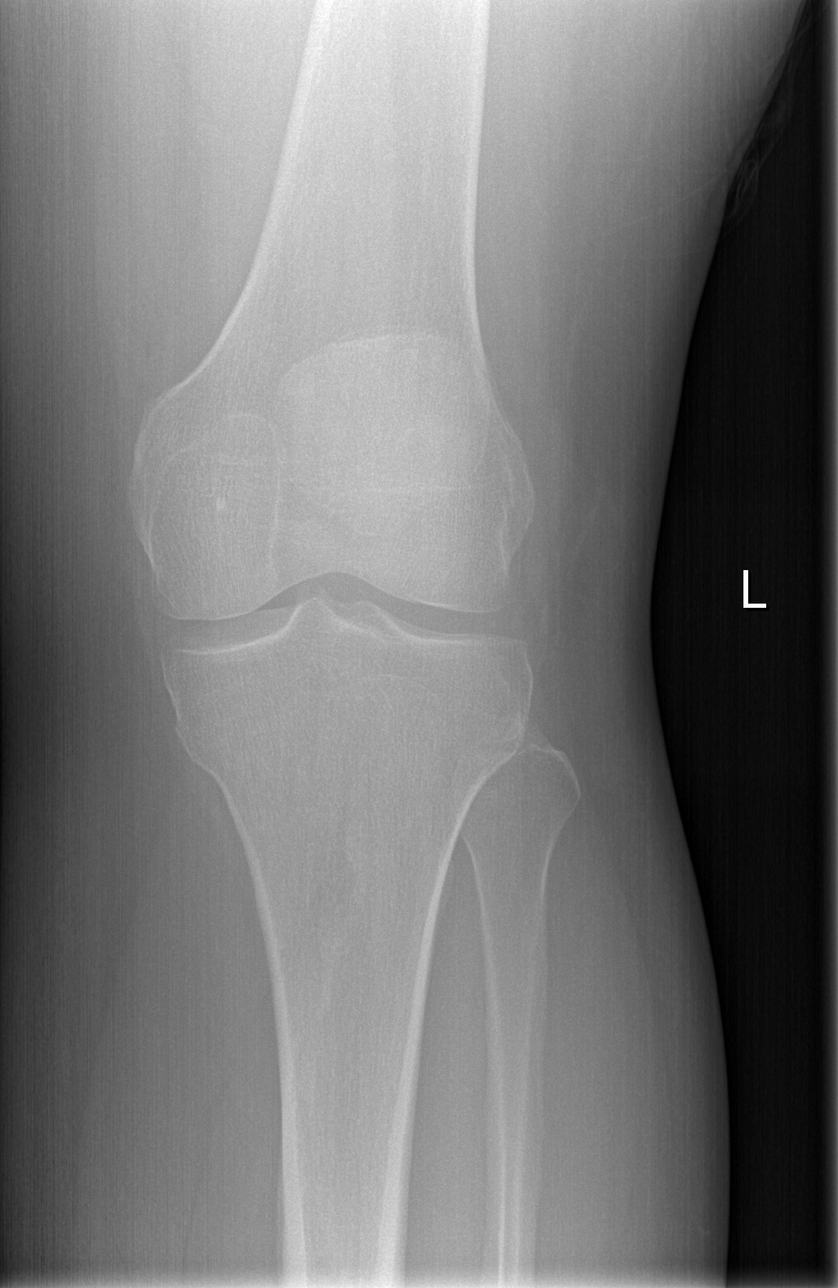

[t knee obl left (1 of 2)]
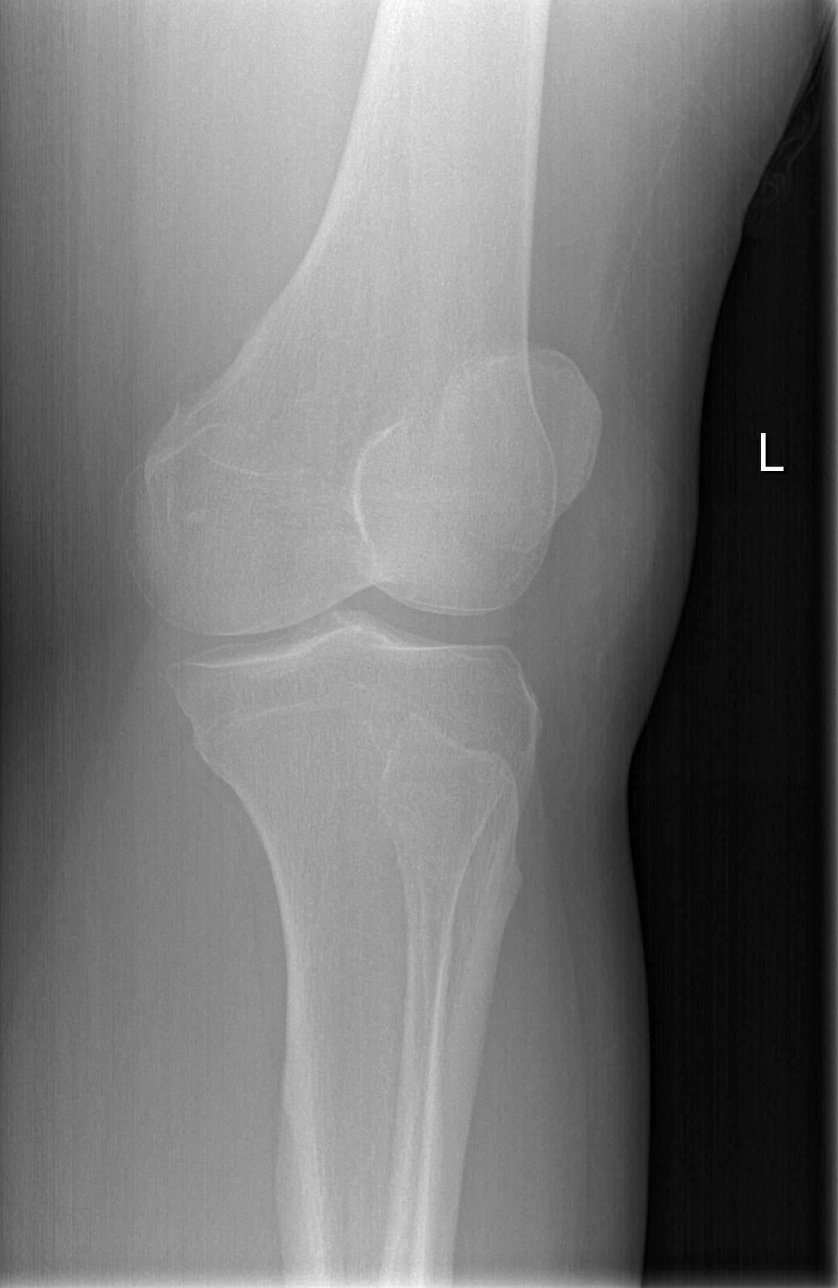

[t knee obl left (2 of 2)]
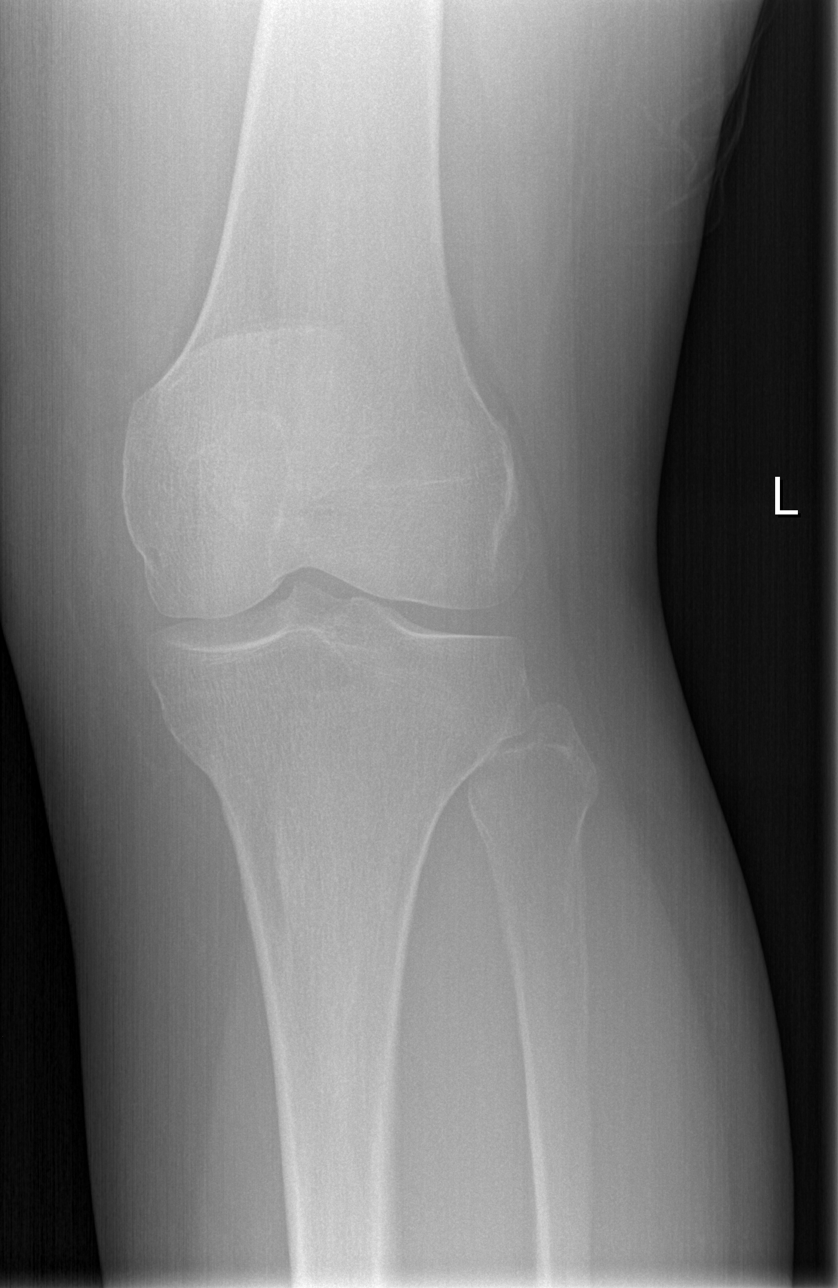

[t knee lat left]
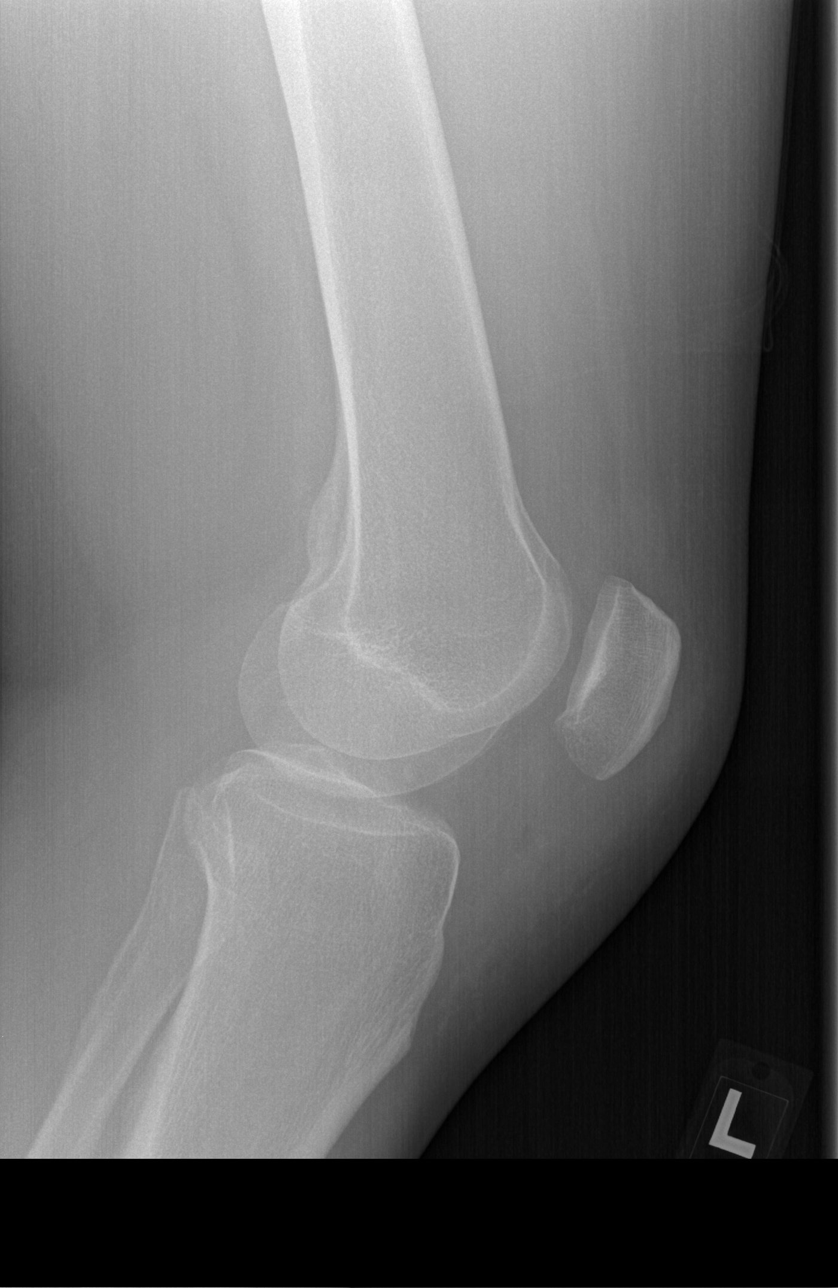

[4 of 4 positions shown; findings below may reference images not displayed]

FINDINGS: No evidence of fracture, dislocation, or joint effusion. No evidence
of arthropathy or other focal bone abnormality. Soft tissues are
unremarkable.
IMPRESSION: Negative exam.

## 2019-05-27 ENCOUNTER — Encounter: Payer: BC Managed Care – PPO | Admitting: Family Medicine

## 2019-06-03 ENCOUNTER — Ambulatory Visit (INDEPENDENT_AMBULATORY_CARE_PROVIDER_SITE_OTHER): Payer: BC Managed Care – PPO | Admitting: Family Medicine

## 2019-06-03 ENCOUNTER — Other Ambulatory Visit: Payer: Self-pay

## 2019-06-03 ENCOUNTER — Encounter: Payer: Self-pay | Admitting: Family Medicine

## 2019-06-03 VITALS — BP 116/78 | HR 74 | Temp 98.4°F | Ht 67.5 in | Wt 212.6 lb

## 2019-06-03 DIAGNOSIS — Z0000 Encounter for general adult medical examination without abnormal findings: Secondary | ICD-10-CM | POA: Diagnosis not present

## 2019-06-03 LAB — CBC WITH DIFFERENTIAL/PLATELET
Basophils Absolute: 0 10*3/uL (ref 0.0–0.1)
Basophils Relative: 0.7 % (ref 0.0–3.0)
Eosinophils Absolute: 0 10*3/uL (ref 0.0–0.7)
Eosinophils Relative: 0.8 % (ref 0.0–5.0)
HCT: 39.7 % (ref 36.0–46.0)
Hemoglobin: 13.4 g/dL (ref 12.0–15.0)
Lymphocytes Relative: 33.6 % (ref 12.0–46.0)
Lymphs Abs: 1.6 10*3/uL (ref 0.7–4.0)
MCHC: 33.9 g/dL (ref 30.0–36.0)
MCV: 96.1 fl (ref 78.0–100.0)
Monocytes Absolute: 0.3 10*3/uL (ref 0.1–1.0)
Monocytes Relative: 6.2 % (ref 3.0–12.0)
Neutro Abs: 2.8 10*3/uL (ref 1.4–7.7)
Neutrophils Relative %: 58.7 % (ref 43.0–77.0)
Platelets: 196 10*3/uL (ref 150.0–400.0)
RBC: 4.13 Mil/uL (ref 3.87–5.11)
RDW: 13.1 % (ref 11.5–15.5)
WBC: 4.8 10*3/uL (ref 4.0–10.5)

## 2019-06-03 LAB — LIPID PANEL
Cholesterol: 189 mg/dL (ref 0–200)
HDL: 56.9 mg/dL (ref >39.00)
LDL Cholesterol: 119 mg/dL — ABNORMAL HIGH (ref 0–99)
NonHDL: 132.26
Total CHOL/HDL Ratio: 3
Triglycerides: 65 mg/dL (ref 0.0–149.0)
VLDL: 13 mg/dL (ref 0.0–40.0)

## 2019-06-03 LAB — COMPREHENSIVE METABOLIC PANEL
ALT: 10 U/L (ref 0–35)
AST: 13 U/L (ref 0–37)
Albumin: 4.4 g/dL (ref 3.5–5.2)
Alkaline Phosphatase: 56 U/L (ref 39–117)
BUN: 13 mg/dL (ref 6–23)
CO2: 27 mEq/L (ref 19–32)
Calcium: 9.2 mg/dL (ref 8.4–10.5)
Chloride: 106 mEq/L (ref 96–112)
Creatinine, Ser: 0.6 mg/dL (ref 0.40–1.20)
GFR: 113.25 mL/min (ref >60.00)
Glucose, Bld: 94 mg/dL (ref 70–99)
Potassium: 4.9 mEq/L (ref 3.5–5.1)
Sodium: 140 mEq/L (ref 135–145)
Total Bilirubin: 0.5 mg/dL (ref 0.2–1.2)
Total Protein: 6.9 g/dL (ref 6.0–8.3)

## 2019-06-03 LAB — VITAMIN D 25 HYDROXY (VIT D DEFICIENCY, FRACTURES): VITD: 30.95 ng/mL (ref 30.00–100.00)

## 2019-06-03 LAB — TSH: TSH: 1.02 u[IU]/mL (ref 0.35–4.50)

## 2019-06-03 NOTE — Progress Notes (Signed)
Patient: Adriana Deleon MRN: [id number] DOB: 1980-12-10 PCP: Orma Flaming, MD     Subjective:  Chief Complaint  Patient presents with  . Annual Exam    HPI: The patient is a 38 y.o. female who presents today for annual exam. She denies any changes to past medical history. There have been no recent hospitalizations. They are following a well balanced diet and exercise plan. Weight has been decreasing steadily. No complaints today. She is doing intermittent fasting, keto and running again.   She wants to make sure she doesn't have a hernia. She will get busy and will have some pain near her incision of her Caesarean section. It will be sensitive for a few days then go away and be fine. She has no issues running or picking up the kids. She feels no bulge or other issues. Just will be a random sensation. Weight loss has also helped some.   FH of both HTN and hyperlipidemia in her mother and father.   Immunization History  Administered Date(s) Administered  . Tdap 05/30/2015   hiv screening: done  Pap smear: 7/72020. Normal.    Review of Systems  Constitutional: Negative for chills, fatigue and fever.  HENT: Negative for dental problem, ear pain, hearing loss and trouble swallowing.   Eyes: Negative for visual disturbance.  Respiratory: Negative for cough, chest tightness and shortness of breath.   Cardiovascular: Negative for chest pain, palpitations and leg swelling.  Gastrointestinal: Negative for abdominal pain, blood in stool, diarrhea and nausea.  Endocrine: Negative for cold intolerance, polydipsia, polyphagia and polyuria.  Genitourinary: Negative for dysuria and hematuria.  Musculoskeletal: Negative.  Negative for arthralgias.  Skin: Negative.  Negative for rash.  Neurological: Positive for headaches. Negative for dizziness.  Psychiatric/Behavioral: Negative for dysphoric mood and sleep disturbance. The patient is not nervous/anxious.     Allergies Patient has No  Known Allergies.  Past Medical History Patient  has a past medical history of Former smoker and GERD (gastroesophageal reflux disease).  Surgical History Patient  has a past surgical history that includes Cesarean section multi-gestational (N/A, 08/02/2015) and Cesarean section (08/02/2015).  Family History Pateint's family history includes Arthritis in her paternal grandfather and paternal grandmother; Asthma in her mother; COPD in her mother; Cancer in her maternal grandfather, maternal grandmother, mother, paternal grandfather, and paternal grandmother; Depression in her mother; Diabetes in her maternal grandfather; Hearing loss in her paternal grandmother; Hyperlipidemia in her brother, father, maternal grandmother, mother, and paternal grandfather; Hypertension in her brother, father, maternal grandmother, mother, paternal grandfather, and paternal grandmother.  Social History Patient  reports that she has quit smoking. Her smoking use included cigarettes. She smoked 0.25 packs per day. She has never used smokeless tobacco. She reports that she does not drink alcohol or use drugs.    Objective: Vitals:   06/03/19 1009  BP: 116/78  Pulse: 74  Temp: 98.4 F (36.9 C)  TempSrc: Other (Comment)  SpO2: 99%  Weight: 212 lb 9.6 oz (96.4 kg)  Height: 5' 7.5" (1.715 m)    Body mass index is 32.81 kg/m.  Physical Exam Vitals signs reviewed.  Constitutional:      Appearance: Normal appearance. She is well-developed.  HENT:     Head: Normocephalic and atraumatic.     Right Ear: Tympanic membrane, ear canal and external ear normal.     Left Ear: Tympanic membrane, ear canal and external ear normal.     Nose: Nose normal.     Mouth/Throat:  Mouth: Mucous membranes are moist.  Eyes:     Extraocular Movements: Extraocular movements intact.     Conjunctiva/sclera: Conjunctivae normal.     Pupils: Pupils are equal, round, and reactive to light.  Neck:     Musculoskeletal: Normal  range of motion and neck supple.     Thyroid: No thyromegaly.  Cardiovascular:     Rate and Rhythm: Normal rate and regular rhythm.     Heart sounds: Normal heart sounds. No murmur.  Pulmonary:     Effort: Pulmonary effort is normal.     Breath sounds: Normal breath sounds.  Abdominal:     General: Abdomen is flat. Bowel sounds are normal. There is no distension.     Palpations: Abdomen is soft.     Tenderness: There is no abdominal tenderness.     Hernia: No hernia is present.     Comments: No tenderness over scar, no bulge in inguinal or ventral areas or over scar   Musculoskeletal: Normal range of motion.  Lymphadenopathy:     Cervical: No cervical adenopathy.  Skin:    General: Skin is warm and dry.     Findings: No rash.  Neurological:     Mental Status: She is alert and oriented to person, place, and time.     Cranial Nerves: No cranial nerve deficit.     Coordination: Coordination normal.     Deep Tendon Reflexes: Reflexes normal.  Psychiatric:        Behavior: Behavior normal.          Office Visit from 06/03/2019 in [facility]  PHQ-2 Total Score  0      Assessment/plan: 1. Annual physical exam Routine lab work today. utd on her HM. Reassurance given regarding her scar. No hernia felt and likely due to scar tissue/nerve irritation. If continues/gets worse she is to let me know. Continue healthy lifestyle and weight loss. F/u in one year or as needed.  Patient counseling [x]    Nutrition: Stressed importance of moderation in sodium/caffeine intake, saturated fat and cholesterol, caloric balance, sufficient intake of fresh fruits, vegetables, fiber, calcium, iron, and 1 mg of folate supplement per day (for females capable of pregnancy).  [x]    Stressed the importance of regular exercise.   []    Substance Abuse: Discussed cessation/primary prevention of tobacco, alcohol, or other drug use; driving or other dangerous activities under the influence;  availability of treatment for abuse.   [x]    Injury prevention: Discussed safety belts, safety helmets, smoke detector, smoking near bedding or upholstery.   [x]    Sexuality: Discussed sexually transmitted diseases, partner selection, use of condoms, avoidance of unintended pregnancy  and contraceptive alternatives.  [x]    Dental health: Discussed importance of regular tooth brushing, flossing, and dental visits.  [x]    Health maintenance and immunizations reviewed. Please refer to Health maintenance section.    - CBC with Differential/Platelet - Comprehensive metabolic panel - Lipid panel - TSH - VITAMIN D 25 Hydroxy (Vit-D Deficiency, Fractures)   Return in about 1 year (around 06/02/2020).   Orma Flaming, MD [place]  06/03/2019

## 2019-06-03 NOTE — Patient Instructions (Signed)
Preventive Care 21-36 Years Old, Female Preventive care refers to visits with your health care provider and lifestyle choices that can promote health and wellness. This includes:  A yearly physical exam. This may also be called an annual well check.  Regular dental visits and eye exams.  Immunizations.  Screening for certain conditions.  Healthy lifestyle choices, such as eating a healthy diet, getting regular exercise, not using drugs or products that contain nicotine and tobacco, and limiting alcohol use. What can I expect for my preventive care visit? Physical exam Your health care provider will check your:  Height and weight. This may be used to calculate body mass index (BMI), which tells if you are at a healthy weight.  Heart rate and blood pressure.  Skin for abnormal spots. Counseling Your health care provider may ask you questions about your:  Alcohol, tobacco, and drug use.  Emotional well-being.  Home and relationship well-being.  Sexual activity.  Eating habits.  Work and work environment.  Method of birth control.  Menstrual cycle.  Pregnancy history. What immunizations do I need?  Influenza (flu) vaccine  This is recommended every year. Tetanus, diphtheria, and pertussis (Tdap) vaccine  You may need a Td booster every 10 years. Varicella (chickenpox) vaccine  You may need this if you have not been vaccinated. Human papillomavirus (HPV) vaccine  If recommended by your health care provider, you may need three doses over 6 months. Measles, mumps, and rubella (MMR) vaccine  You may need at least one dose of MMR. You may also need a second dose. Meningococcal conjugate (MenACWY) vaccine  One dose is recommended if you are age 19-21 years and a first-year college student living in a residence hall, or if you have one of several medical conditions. You may also need additional booster doses. Pneumococcal conjugate (PCV13) vaccine  You may need  this if you have certain conditions and were not previously vaccinated. Pneumococcal polysaccharide (PPSV23) vaccine  You may need one or two doses if you smoke cigarettes or if you have certain conditions. Hepatitis A vaccine  You may need this if you have certain conditions or if you travel or work in places where you may be exposed to hepatitis A. Hepatitis B vaccine  You may need this if you have certain conditions or if you travel or work in places where you may be exposed to hepatitis B. Haemophilus influenzae type b (Hib) vaccine  You may need this if you have certain conditions. You may receive vaccines as individual doses or as more than one vaccine together in one shot (combination vaccines). Talk with your health care provider about the risks and benefits of combination vaccines. What tests do I need?  Blood tests  Lipid and cholesterol levels. These may be checked every 5 years starting at age 20.  Hepatitis C test.  Hepatitis B test. Screening  Diabetes screening. This is done by checking your blood sugar (glucose) after you have not eaten for a while (fasting).  Sexually transmitted disease (STD) testing.  BRCA-related cancer screening. This may be done if you have a family history of breast, ovarian, tubal, or peritoneal cancers.  Pelvic exam and Pap test. This may be done every 3 years starting at age 21. Starting at age 30, this may be done every 5 years if you have a Pap test in combination with an HPV test. Talk with your health care provider about your test results, treatment options, and if necessary, the need for more tests.   Follow these instructions at home: Eating and drinking   Eat a diet that includes fresh fruits and vegetables, whole grains, lean protein, and low-fat dairy.  Take vitamin and mineral supplements as recommended by your health care provider.  Do not drink alcohol if: ? Your health care provider tells you not to drink. ? You are  pregnant, may be pregnant, or are planning to become pregnant.  If you drink alcohol: ? Limit how much you have to 0-1 drink a day. ? Be aware of how much alcohol is in your drink. In the [place] one drink equals one 12 oz bottle of beer (355 mL), one 5 oz glass of wine (148 mL), or one 1 oz glass of hard liquor (44 mL). Lifestyle  Take daily care of your teeth and gums.  Stay active. Exercise for at least 30 minutes on 5 or more days each week.  Do not use any products that contain nicotine or tobacco, such as cigarettes, e-cigarettes, and chewing tobacco. If you need help quitting, ask your health care provider.  If you are sexually active, practice safe sex. Use a condom or other form of birth control (contraception) in order to prevent pregnancy and STIs (sexually transmitted infections). If you plan to become pregnant, see your health care provider for a preconception visit. What's next?  Visit your health care provider once a year for a well check visit.  Ask your health care provider how often you should have your eyes and teeth checked.  Stay up to date on all vaccines. This information is not intended to replace advice given to you by your health care provider. Make sure you discuss any questions you have with your health care provider. Document Released: 11/27/2001 Document Revised: 06/12/2018 Document Reviewed: 06/12/2018 Elsevier Patient Education  2020 Elsevier Inc.  

## 2019-08-12 ENCOUNTER — Other Ambulatory Visit: Payer: Self-pay

## 2019-08-12 DIAGNOSIS — Z20822 Contact with and (suspected) exposure to covid-19: Secondary | ICD-10-CM

## 2019-08-13 LAB — NOVEL CORONAVIRUS, NAA: SARS-CoV-2, NAA: NOT DETECTED

## 2019-09-22 ENCOUNTER — Other Ambulatory Visit: Payer: Self-pay

## 2019-09-22 ENCOUNTER — Ambulatory Visit (INDEPENDENT_AMBULATORY_CARE_PROVIDER_SITE_OTHER): Payer: BC Managed Care – PPO

## 2019-09-22 DIAGNOSIS — Z23 Encounter for immunization: Secondary | ICD-10-CM

## 2019-09-25 ENCOUNTER — Other Ambulatory Visit: Payer: Self-pay

## 2019-09-25 DIAGNOSIS — Z20822 Contact with and (suspected) exposure to covid-19: Secondary | ICD-10-CM

## 2019-09-28 LAB — NOVEL CORONAVIRUS, NAA: SARS-CoV-2, NAA: NOT DETECTED

## 2019-09-29 ENCOUNTER — Telehealth: Payer: Self-pay | Admitting: Family Medicine

## 2019-09-29 NOTE — Telephone Encounter (Signed)
Patient called back already aware of negative result from My Chart

## 2019-12-10 ENCOUNTER — Ambulatory Visit: Payer: BC Managed Care – PPO | Attending: Family

## 2019-12-10 DIAGNOSIS — Z23 Encounter for immunization: Secondary | ICD-10-CM

## 2019-12-10 NOTE — Progress Notes (Signed)
   Covid-19 Vaccination Clinic  Name:  Adriana Deleon    MRN: [id number] DOB: [birth year]  12/10/2019  [name] was observed post Covid-19 immunization for 15 minutes without incidence. She was provided with Vaccine Information Sheet and instruction to access the V-Safe system.   [name] was instructed to call 911 with any severe reactions post vaccine: Marland Kitchen Difficulty breathing  . Swelling of your face and throat  . A fast heartbeat  . A bad rash all over your body  . Dizziness and weakness    Immunizations Administered    Name Date Dose VIS Date Route   Moderna COVID-19 Vaccine 12/10/2019  2:04 PM 0.5 mL 09/15/2019 Intramuscular   Manufacturer: Moderna   [id number]   [id number]

## 2020-01-12 ENCOUNTER — Ambulatory Visit: Payer: BC Managed Care – PPO | Attending: Family

## 2020-01-12 DIAGNOSIS — Z23 Encounter for immunization: Secondary | ICD-10-CM

## 2020-01-12 NOTE — Progress Notes (Signed)
   Covid-19 Vaccination Clinic  Name:  Adriana Deleon    MRN: [id number] DOB: 30-Aug-1981  01/12/2020  [name] was observed post Covid-19 immunization for 15 minutes without incident. She was provided with Vaccine Information Sheet and instruction to access the V-Safe system.   [name] was instructed to call 911 with any severe reactions post vaccine: Marland Kitchen Difficulty breathing  . Swelling of face and throat  . A fast heartbeat  . A bad rash all over body  . Dizziness and weakness   Immunizations Administered    Name Date Dose VIS Date Route   Moderna COVID-19 Vaccine 01/12/2020  9:42 AM 0.5 mL 09/15/2019 Intramuscular   Manufacturer: Moderna   [id number]   [id number]

## 2020-04-20 ENCOUNTER — Ambulatory Visit (INDEPENDENT_AMBULATORY_CARE_PROVIDER_SITE_OTHER): Payer: BC Managed Care – PPO | Admitting: Family Medicine

## 2020-04-20 ENCOUNTER — Other Ambulatory Visit: Payer: Self-pay

## 2020-04-20 ENCOUNTER — Encounter: Payer: Self-pay | Admitting: Family Medicine

## 2020-04-20 VITALS — BP 100/66 | HR 76 | Temp 98.5°F | Ht 67.5 in | Wt 223.2 lb

## 2020-04-20 DIAGNOSIS — R0789 Other chest pain: Secondary | ICD-10-CM

## 2020-04-20 MED ORDER — PANTOPRAZOLE SODIUM 40 MG PO TBEC: 40.0000 mg | DELAYED_RELEASE_TABLET | Freq: Every day | ORAL | 3 refills | Status: DC

## 2020-04-20 NOTE — Progress Notes (Signed)
Patient: Adriana Deleon MRN: [id number] DOB: 02-22-1981 PCP: Orma Flaming, MD     Subjective:  Chief Complaint  Patient presents with   Gastroesophageal Reflux   Chest Pain    HPI: The patient is a 39 y.o. female who presents today for possible indigestion and chest pain/pressure. She states its been happening forever and she normally just lets it go. She states she had an episode about 2 weeks ago and it didn't go right away. It took about an hour to go away. She feels a squeezing sensation in her chest and into her jaw. Pain/squeezing is substernal, not in her stomach area. Sometimes feels it in her back. It is not always associated with food and can just happen when she hasn't eaten and is sitting on the couch. It is worse with greasy, fatty and fried foods. Denies any nausea/vomiting. No fever/chills. Typically goes away after 20 minutes and it comes and goes. She has not taken any over the counter medication to help possible reflux. No shortness of breath, sweating or pain down left arm. No hx of MI/CAD or SCD.  She does not drink alcohol or take NSAIDs. Does live off caffeine.   She has no chest pain or pressure with exercise.    Review of Systems  Constitutional: Negative for chills, diaphoresis and fever.  Respiratory: Negative for chest tightness, shortness of breath and wheezing.   Cardiovascular: Negative for chest pain and palpitations.  Gastrointestinal: Negative for abdominal pain, blood in stool, nausea and vomiting.   Allergies Patient has No Known Allergies.  Past Medical History Patient  has a past medical history of Former smoker and GERD (gastroesophageal reflux disease).  Surgical History Patient  has a past surgical history that includes Cesarean section multi-gestational (N/A, 08/02/2015) and Cesarean section (08/02/2015).  Family History Pateint's family history includes Arthritis in her paternal grandfather and paternal grandmother; Asthma in her  mother; COPD in her mother; Cancer in her maternal grandfather, maternal grandmother, mother, paternal grandfather, and paternal grandmother; Depression in her mother; Diabetes in her maternal grandfather; Hearing loss in her paternal grandmother; Hyperlipidemia in her brother, father, maternal grandmother, mother, and paternal grandfather; Hypertension in her brother, father, maternal grandmother, mother, paternal grandfather, and paternal grandmother.  Social History Patient  reports that she has quit smoking. Her smoking use included cigarettes. She smoked 0.25 packs per day. She has never used smokeless tobacco. She reports that she does not drink alcohol and does not use drugs.    Objective: Vitals:   04/20/20 1333  BP: 100/66  Pulse: 76  Temp: 98.5 F (36.9 C)  SpO2: 98%  Weight: 223 lb 3.2 oz (101.2 kg)  Height: 5' 7.5" (1.715 m)    Body mass index is 34.44 kg/m.  Physical Exam Vitals reviewed.  Constitutional:      Appearance: She is well-developed. She is obese.  HENT:     Head: Normocephalic and atraumatic.     Right Ear: External ear normal.     Left Ear: External ear normal.  Eyes:     Conjunctiva/sclera: Conjunctivae normal.     Pupils: Pupils are equal, round, and reactive to light.  Neck:     Thyroid: No thyromegaly.  Cardiovascular:     Rate and Rhythm: Normal rate and regular rhythm.     Heart sounds: Normal heart sounds. No murmur heard.   Pulmonary:     Effort: Pulmonary effort is normal.     Breath sounds: Normal breath sounds.  Abdominal:  General: Bowel sounds are normal. There is no distension.     Palpations: Abdomen is soft.     Tenderness: There is no abdominal tenderness.     Comments: Negative murphys sign   Musculoskeletal:     Cervical back: Normal range of motion and neck supple.  Lymphadenopathy:     Cervical: No cervical adenopathy.  Skin:    General: Skin is warm and dry.     Findings: No rash.  Neurological:     Mental Status:  She is alert and oriented to person, place, and time.     Cranial Nerves: No cranial nerve deficit.     Coordination: Coordination normal.     Deep Tendon Reflexes: Reflexes normal.  Psychiatric:        Behavior: Behavior normal.    Ekg: wnl with rate variation     Assessment/plan: 1. Other chest pain Indigestion/reflux vs. Gallstones. Cardiac is low on differential. Handout given on GERD food choices and advised she decrease caffeine. Want her to start an otc h2 blocker (pepcid) daily. If this doesn't work, can start the protonix that I sent in, but would like trial with histamine blocker first. I have low clinical suspicion for cardiac disease, but she is anxious about this and we will order a treadmill stress test.  -she has no clinical findings to suggest gallstones and discussed since normal exam do not feel like we need to image her at this time. Precautions given though and if not getting better or pain continues we can do this with labs. She is in agreement.  - EKG 12-Lead - Exercise Tolerance Test; Future   This visit occurred during the SARS-CoV-2 public health emergency.  Safety protocols were in place, including screening questions prior to the visit, additional usage of staff PPE, and extensive cleaning of exam room while observing appropriate contact time as indicated for disinfecting solutions.     No follow-ups on file.   Orma Flaming, MD [place]   04/20/2020

## 2020-04-20 NOTE — Patient Instructions (Addendum)
1) for possible GERD I want you to try over the counter pepcid first. If this doesn't work then start the px medication (protonix) I sent in. Will take first thing in the AM before you eat. See food choices below.   2) ekg looks okay, ordering stress test as well, but very low clinical index of suspicion for your heart.    Food Choices for Gastroesophageal Reflux Disease, Adult When you have gastroesophageal reflux disease (GERD), the foods you eat and your eating habits are very important. Choosing the right foods can help ease your discomfort. Think about working with a nutrition specialist (dietitian) to help you make good choices. What are tips for following this plan?  Meals  Choose healthy foods that are low in fat, such as fruits, vegetables, whole grains, low-fat dairy products, and lean meat, fish, and poultry.  Eat small meals often instead of 3 large meals a day. Eat your meals slowly, and in a place where you are relaxed. Avoid bending over or lying down until 2-3 hours after eating.  Avoid eating meals 2-3 hours before bed.  Avoid drinking a lot of liquid with meals.  Cook foods using methods other than frying. Bake, grill, or broil food instead.  Avoid or limit: ? Chocolate. ? Peppermint or spearmint. ? Alcohol. ? Pepper. ? Black and decaffeinated coffee. ? Black and decaffeinated tea. ? Bubbly (carbonated) soft drinks. ? Caffeinated energy drinks and soft drinks.  Limit high-fat foods such as: ? Fatty meat or fried foods. ? Whole milk, cream, butter, or ice cream. ? Nuts and nut butters. ? Pastries, donuts, and sweets made with butter or shortening.  Avoid foods that cause symptoms. These foods may be different for everyone. Common foods that cause symptoms include: ? Tomatoes. ? Oranges, lemons, and limes. ? Peppers. ? Spicy food. ? Onions and garlic. ? Vinegar. Lifestyle  Maintain a healthy weight. Ask your doctor what weight is healthy for you. If you  need to lose weight, work with your doctor to do so safely.  Exercise for at least 30 minutes for 5 or more days each week, or as told by your doctor.  Wear loose-fitting clothes.  Do not smoke. If you need help quitting, ask your doctor.  Sleep with the head of your bed higher than your feet. Use a wedge under the mattress or blocks under the bed frame to raise the head of the bed. Summary  When you have gastroesophageal reflux disease (GERD), food and lifestyle choices are very important in easing your symptoms.  Eat small meals often instead of 3 large meals a day. Eat your meals slowly, and in a place where you are relaxed.  Limit high-fat foods such as fatty meat or fried foods.  Avoid bending over or lying down until 2-3 hours after eating.  Avoid peppermint and spearmint, caffeine, alcohol, and chocolate. This information is not intended to replace advice given to you by your health care provider. Make sure you discuss any questions you have with your health care provider. Document Revised: 01/22/2019 Document Reviewed: 11/06/2016 Elsevier Patient Education  [employer].

## 2020-05-18 ENCOUNTER — Telehealth: Payer: Self-pay | Admitting: Family Medicine

## 2020-05-18 NOTE — Telephone Encounter (Signed)
[facility] called and needs the patients stressed test expected day pushed out till the August 18th. Patient had to reschedule the appointment since her kids were sent home early.

## 2020-05-18 NOTE — Telephone Encounter (Signed)
Date has been extended.

## 2020-06-03 ENCOUNTER — Other Ambulatory Visit: Payer: Self-pay

## 2020-06-03 ENCOUNTER — Encounter: Payer: Self-pay | Admitting: Family Medicine

## 2020-06-03 ENCOUNTER — Ambulatory Visit (INDEPENDENT_AMBULATORY_CARE_PROVIDER_SITE_OTHER): Payer: BC Managed Care – PPO | Admitting: Family Medicine

## 2020-06-03 VITALS — BP 100/68 | HR 56 | Temp 98.4°F | Ht 68.0 in | Wt 216.8 lb

## 2020-06-03 DIAGNOSIS — Z0000 Encounter for general adult medical examination without abnormal findings: Secondary | ICD-10-CM | POA: Diagnosis not present

## 2020-06-03 DIAGNOSIS — D229 Melanocytic nevi, unspecified: Secondary | ICD-10-CM | POA: Diagnosis not present

## 2020-06-03 DIAGNOSIS — Z1159 Encounter for screening for other viral diseases: Secondary | ICD-10-CM | POA: Diagnosis not present

## 2020-06-03 NOTE — Patient Instructions (Signed)
Preventive Care 37-37 Years Old, Female Preventive care refers to visits with your health care provider and lifestyle choices that can promote health and wellness. This includes:  A yearly physical exam. This may also be called an annual well check.  Regular dental visits and eye exams.  Immunizations.  Screening for certain conditions.  Healthy lifestyle choices, such as eating a healthy diet, getting regular exercise, not using drugs or products that contain nicotine and tobacco, and limiting alcohol use. What can I expect for my preventive care visit? Physical exam Your health care provider will check your:  Height and weight. This may be used to calculate body mass index (BMI), which tells if you are at a healthy weight.  Heart rate and blood pressure.  Skin for abnormal spots. Counseling Your health care provider may ask you questions about your:  Alcohol, tobacco, and drug use.  Emotional well-being.  Home and relationship well-being.  Sexual activity.  Eating habits.  Work and work Statistician.  Method of birth control.  Menstrual cycle.  Pregnancy history. What immunizations do I need?  Influenza (flu) vaccine  This is recommended every year. Tetanus, diphtheria, and pertussis (Tdap) vaccine  You may need a Td booster every 10 years. Varicella (chickenpox) vaccine  You may need this if you have not been vaccinated. Human papillomavirus (HPV) vaccine  If recommended by your health care provider, you may need three doses over 6 months. Measles, mumps, and rubella (MMR) vaccine  You may need at least one dose of MMR. You may also need a second dose. Meningococcal conjugate (MenACWY) vaccine  One dose is recommended if you are age 37-21 years and a first-year college student living in a residence hall, or if you have one of several medical conditions. You may also need additional booster doses. Pneumococcal conjugate (PCV13) vaccine  You may need  this if you have certain conditions and were not previously vaccinated. Pneumococcal polysaccharide (PPSV23) vaccine  You may need one or two doses if you smoke cigarettes or if you have certain conditions. Hepatitis A vaccine  You may need this if you have certain conditions or if you travel or work in places where you may be exposed to hepatitis A. Hepatitis B vaccine  You may need this if you have certain conditions or if you travel or work in places where you may be exposed to hepatitis B. Haemophilus influenzae type b (Hib) vaccine  You may need this if you have certain conditions. You may receive vaccines as individual doses or as more than one vaccine together in one shot (combination vaccines). Talk with your health care provider about the risks and benefits of combination vaccines. What tests do I need?  Blood tests  Lipid and cholesterol levels. These may be checked every 5 years starting at age 2.  Hepatitis C test.  Hepatitis B test. Screening  Diabetes screening. This is done by checking your blood sugar (glucose) after you have not eaten for a while (fasting).  Sexually transmitted disease (STD) testing.  BRCA-related cancer screening. This may be done if you have a family history of breast, ovarian, tubal, or peritoneal cancers.  Pelvic exam and Pap test. This may be done every 3 years starting at age 37. Starting at age 34, this may be done every 5 years if you have a Pap test in combination with an HPV test. Talk with your health care provider about your test results, treatment options, and if necessary, the need for more tests.  Follow these instructions at home: Eating and drinking   Eat a diet that includes fresh fruits and vegetables, whole grains, lean protein, and low-fat dairy.  Take vitamin and mineral supplements as recommended by your health care provider.  Do not drink alcohol if: ? Your health care provider tells you not to drink. ? You are  pregnant, may be pregnant, or are planning to become pregnant.  If you drink alcohol: ? Limit how much you have to 0-1 drink a day. ? Be aware of how much alcohol is in your drink. In the [place] one drink equals one 12 oz bottle of beer (355 mL), one 5 oz glass of wine (148 mL), or one 1 oz glass of hard liquor (44 mL). Lifestyle  Take daily care of your teeth and gums.  Stay active. Exercise for at least 30 minutes on 5 or more days each week.  Do not use any products that contain nicotine or tobacco, such as cigarettes, e-cigarettes, and chewing tobacco. If you need help quitting, ask your health care provider.  If you are sexually active, practice safe sex. Use a condom or other form of birth control (contraception) in order to prevent pregnancy and STIs (sexually transmitted infections). If you plan to become pregnant, see your health care provider for a preconception visit. What's next?  Visit your health care provider once a year for a well check visit.  Ask your health care provider how often you should have your eyes and teeth checked.  Stay up to date on all vaccines. This information is not intended to replace advice given to you by your health care provider. Make sure you discuss any questions you have with your health care provider. Document Revised: 06/12/2018 Document Reviewed: 06/12/2018 Elsevier Patient Education  2020 [employer].

## 2020-06-03 NOTE — Progress Notes (Signed)
Patient: Adriana Deleon MRN: [id number] DOB: Mar 13, 1981 PCP: Orma Flaming, MD     Subjective:  Chief Complaint  Patient presents with  . Annual Exam    HPI: The patient is a 39 y.o. female who presents today for annual exam. She denies any changes to past medical history. There have been no recent hospitalizations. They are following a well balanced diet and exercise plan. She was boxing, now she just walks and jogs 4-5 times weekly. Weight has been decreasing steadily. No complaints today.   FH of both HTN and hyperlipidemia in her mother and father. mom had lung cancer (small cell). No colon or breast cancer in first degree relative.   Immunization History  Administered Date(s) Administered  . Influenza,inj,Quad PF,6+ Mos 09/22/2019  . Moderna SARS-COVID-2 Vaccination 12/10/2019, 01/12/2020  . Tdap 05/30/2015   Colonoscopy: routine screening  Mammogram: routine screening  Pap smear: 04/21/2019  Review of Systems  Constitutional: Negative for chills, fatigue and fever.  HENT: Negative for dental problem, ear pain, hearing loss and trouble swallowing.   Eyes: Negative for visual disturbance.  Respiratory: Negative for cough, chest tightness and shortness of breath.   Cardiovascular: Negative for chest pain, palpitations and leg swelling.  Gastrointestinal: Negative for abdominal pain, blood in stool, diarrhea and nausea.  Endocrine: Negative for cold intolerance, polydipsia, polyphagia and polyuria.  Genitourinary: Negative for dysuria, frequency, hematuria and pelvic pain.  Musculoskeletal: Negative for arthralgias.  Skin: Negative for rash.  Neurological: Negative for dizziness and headaches.  Psychiatric/Behavioral: Negative for dysphoric mood and sleep disturbance. The patient is not nervous/anxious.     Allergies Patient has No Known Allergies.  Past Medical History Patient  has a past medical history of Former smoker and GERD (gastroesophageal reflux  disease).  Surgical History Patient  has a past surgical history that includes Cesarean section multi-gestational (N/A, 08/02/2015) and Cesarean section (08/02/2015).  Family History Pateint's family history includes Arthritis in her paternal grandfather and paternal grandmother; Asthma in her mother; COPD in her mother; Cancer in her maternal grandfather, maternal grandmother, mother, paternal grandfather, and paternal grandmother; Depression in her mother; Diabetes in her maternal grandfather; Hearing loss in her paternal grandmother; Hyperlipidemia in her brother, father, maternal grandmother, mother, and paternal grandfather; Hypertension in her brother, father, maternal grandmother, mother, paternal grandfather, and paternal grandmother.  Social History Patient  reports that she has quit smoking. Her smoking use included cigarettes. She smoked 0.25 packs per day. She has never used smokeless tobacco. She reports that she does not drink alcohol and does not use drugs.    Objective: Vitals:   06/03/20 1003  BP: 100/68  Pulse: (!) 56  Temp: 98.4 F (36.9 C)  TempSrc: Temporal  SpO2: 97%  Weight: 216 lb 12.8 oz (98.3 kg)  Height: 5\' 8"  (1.727 m)    Body mass index is 32.96 kg/m.  Physical Exam Vitals reviewed.  Constitutional:      Appearance: Normal appearance. She is well-developed. She is obese.  HENT:     Head: Normocephalic and atraumatic.     Right Ear: Tympanic membrane, ear canal and external ear normal.     Left Ear: Tympanic membrane and external ear normal.     Nose: Nose normal.     Mouth/Throat:     Mouth: Mucous membranes are moist.  Eyes:     Extraocular Movements: Extraocular movements intact.     Conjunctiva/sclera: Conjunctivae normal.     Pupils: Pupils are equal, round, and reactive to light.  Neck:  Thyroid: No thyromegaly.  Cardiovascular:     Rate and Rhythm: Normal rate and regular rhythm.     Pulses: Normal pulses.     Heart sounds: Normal  heart sounds. No murmur heard.   Pulmonary:     Effort: Pulmonary effort is normal.     Breath sounds: Normal breath sounds.  Abdominal:     General: Bowel sounds are normal. There is no distension.     Palpations: Abdomen is soft.     Tenderness: There is no abdominal tenderness.  Musculoskeletal:     Cervical back: Normal range of motion and neck supple.  Lymphadenopathy:     Cervical: No cervical adenopathy.  Skin:    General: Skin is warm and dry.     Capillary Refill: Capillary refill takes less than 2 seconds.     Findings: No rash.     Comments: Irregular mole on back  Neurological:     General: No focal deficit present.     Mental Status: She is alert and oriented to person, place, and time.     Cranial Nerves: No cranial nerve deficit.     Coordination: Coordination normal.     Deep Tendon Reflexes: Reflexes normal.  Psychiatric:        Mood and Affect: Mood normal.        Behavior: Behavior normal.       Office Visit from 06/03/2020 in [place]  PHQ-2 Total Score 0          Assessment/plan: 1. Annual physical exam Hm reviewed and UTD. Fasting labs today. Doing wonderful job on weight loss. Finished grad school. Continue exercise and weight loss. See her back in one year or as needed.  Patient counseling [x]    Nutrition: Stressed importance of moderation in sodium/caffeine intake, saturated fat and cholesterol, caloric balance, sufficient intake of fresh fruits, vegetables, fiber, calcium, iron, and 1 mg of folate supplement per day (for females capable of pregnancy).  [x]    Stressed the importance of regular exercise.   []    Substance Abuse: Discussed cessation/primary prevention of tobacco, alcohol, or other drug use; driving or other dangerous activities under the influence; availability of treatment for abuse.   [x]    Injury prevention: Discussed safety belts, safety helmets, smoke detector, smoking near bedding or upholstery.   [x]     Sexuality: Discussed sexually transmitted diseases, partner selection, use of condoms, avoidance of unintended pregnancy  and contraceptive alternatives.  [x]    Dental health: Discussed importance of regular tooth brushing, flossing, and dental visits.  [x]    Health maintenance and immunizations reviewed. Please refer to Health maintenance section.    - CBC with Differential/Platelet; Future - Hemoglobin A1c; Future - TSH; Future - Lipid panel; Future - COMPLETE METABOLIC PANEL WITH GFR; Future  2. Encounter for hepatitis C screening test for low risk patient  - Hepatitis C antibody; Future  3. Atypical  Mole  -referral to derm. Strong family hx of skin cancers.   This visit occurred during the SARS-CoV-2 public health emergency.  Safety protocols were in place, including screening questions prior to the visit, additional usage of staff PPE, and extensive cleaning of exam room while observing appropriate contact time as indicated for disinfecting solutions.      Return in about 1 year (around 06/03/2021) for annual .     Orma Flaming, MD [place]  06/03/2020

## 2020-06-06 ENCOUNTER — Other Ambulatory Visit: Payer: Self-pay | Admitting: Family Medicine

## 2020-06-06 DIAGNOSIS — D72819 Decreased white blood cell count, unspecified: Secondary | ICD-10-CM

## 2020-06-06 LAB — CBC WITH DIFFERENTIAL/PLATELET
Absolute Monocytes: 299 cells/uL (ref 200–950)
Basophils Absolute: 29 cells/uL (ref 0–200)
Basophils Relative: 0.8 %
Eosinophils Absolute: 29 cells/uL (ref 15–500)
Eosinophils Relative: 0.8 %
HCT: 38 % (ref 35.0–45.0)
Hemoglobin: 13.1 g/dL (ref 11.7–15.5)
Lymphs Abs: 1199 cells/uL (ref 850–3900)
MCH: 33.1 pg — ABNORMAL HIGH (ref 27.0–33.0)
MCHC: 34.5 g/dL (ref 32.0–36.0)
MCV: 96 fL (ref 80.0–100.0)
MPV: 11.9 fL (ref 7.5–12.5)
Monocytes Relative: 8.3 %
Neutro Abs: 2045 cells/uL (ref 1500–7800)
Neutrophils Relative %: 56.8 %
Platelets: 185 10*3/uL (ref 140–400)
RBC: 3.96 10*6/uL (ref 3.80–5.10)
RDW: 12.2 % (ref 11.0–15.0)
Total Lymphocyte: 33.3 %
WBC: 3.6 10*3/uL — ABNORMAL LOW (ref 3.8–10.8)

## 2020-06-06 LAB — HEPATITIS C ANTIBODY
Hepatitis C Ab: NONREACTIVE
SIGNAL TO CUT-OFF: 0.02 (ref <1.00)

## 2020-06-06 LAB — HEMOGLOBIN A1C
Hgb A1c MFr Bld: 4.9 % of total Hgb (ref <5.7)
Mean Plasma Glucose: 94 (calc)
eAG (mmol/L): 5.2 (calc)

## 2020-06-06 LAB — COMPLETE METABOLIC PANEL WITH GFR
AG Ratio: 2 (calc) (ref 1.0–2.5)
ALT: 9 U/L (ref 6–29)
AST: 13 U/L (ref 10–30)
Albumin: 4.5 g/dL (ref 3.6–5.1)
Alkaline phosphatase (APISO): 49 U/L (ref 31–125)
BUN: 11 mg/dL (ref 7–25)
CO2: 25 mmol/L (ref 20–32)
Calcium: 9 mg/dL (ref 8.6–10.2)
Chloride: 107 mmol/L (ref 98–110)
Creat: 0.65 mg/dL (ref 0.50–1.10)
GFR, Est African American: 132 mL/min/{1.73_m2} (ref >=60)
GFR, Est Non African American: 114 mL/min/{1.73_m2} (ref >=60)
Globulin: 2.2 g/dL (calc) (ref 1.9–3.7)
Glucose, Bld: 87 mg/dL (ref 65–99)
Potassium: 4.4 mmol/L (ref 3.5–5.3)
Sodium: 140 mmol/L (ref 135–146)
Total Bilirubin: 1 mg/dL (ref 0.2–1.2)
Total Protein: 6.7 g/dL (ref 6.1–8.1)

## 2020-06-06 LAB — LIPID PANEL
Cholesterol: 182 mg/dL (ref <200)
HDL: 67 mg/dL (ref >=50)
LDL Cholesterol (Calc): 101 mg/dL (calc) — ABNORMAL HIGH
Non-HDL Cholesterol (Calc): 115 mg/dL (calc) (ref <130)
Total CHOL/HDL Ratio: 2.7 (calc) (ref <5.0)
Triglycerides: 55 mg/dL (ref <150)

## 2020-06-06 LAB — TSH: TSH: 0.73 mIU/L

## 2020-06-14 ENCOUNTER — Ambulatory Visit (INDEPENDENT_AMBULATORY_CARE_PROVIDER_SITE_OTHER): Payer: BC Managed Care – PPO

## 2020-06-14 ENCOUNTER — Other Ambulatory Visit: Payer: Self-pay

## 2020-06-14 DIAGNOSIS — R0789 Other chest pain: Secondary | ICD-10-CM | POA: Diagnosis not present

## 2020-06-14 LAB — EXERCISE TOLERANCE TEST
Estimated workload: 13.7 METS
Exercise duration (min): 12 min
Exercise duration (sec): 1 s
MPHR: 184 {beats}/min
Peak HR: 179 {beats}/min
Percent HR: 97 %
RPE: 16
Rest HR: 68 {beats}/min

## 2020-10-21 ENCOUNTER — Other Ambulatory Visit: Payer: Self-pay

## 2020-10-24 ENCOUNTER — Other Ambulatory Visit: Payer: BC Managed Care – PPO

## 2020-11-07 ENCOUNTER — Telehealth: Payer: Self-pay

## 2020-11-07 MED ORDER — MUPIROCIN 2 % EX OINT: 1.0000 "application " | TOPICAL_OINTMENT | Freq: Three times a day (TID) | CUTANEOUS | 1 refills | Status: DC

## 2020-11-07 NOTE — Telephone Encounter (Signed)
Pt is requesting a refill for Bactroban for her Impetigo

## 2020-11-07 NOTE — Telephone Encounter (Signed)
Sent in.  Maryclare Nydam, MD [facility]   

## 2020-11-09 ENCOUNTER — Other Ambulatory Visit: Payer: BC Managed Care – PPO

## 2020-11-14 ENCOUNTER — Other Ambulatory Visit (INDEPENDENT_AMBULATORY_CARE_PROVIDER_SITE_OTHER): Payer: BC Managed Care – PPO

## 2020-11-14 ENCOUNTER — Telehealth: Payer: Self-pay

## 2020-11-14 DIAGNOSIS — D72818 Other decreased white blood cell count: Secondary | ICD-10-CM

## 2020-11-14 NOTE — Telephone Encounter (Signed)
Patient had a lab appt scheduled when we had to cancel due to snow and rescheduled for this afternoon can we put the orders in again for a retest of  Low white blood cells count  Her appt is at 3:45

## 2020-11-14 NOTE — Telephone Encounter (Signed)
Order is in! Aw

## 2020-11-15 LAB — CBC WITH DIFFERENTIAL/PLATELET
Basophils Absolute: 0.1 10*3/uL (ref 0.0–0.1)
Basophils Relative: 1.2 % (ref 0.0–3.0)
Eosinophils Absolute: 0 10*3/uL (ref 0.0–0.7)
Eosinophils Relative: 0.3 % (ref 0.0–5.0)
HCT: 39 % (ref 36.0–46.0)
Hemoglobin: 13.5 g/dL (ref 12.0–15.0)
Lymphocytes Relative: 28.5 % (ref 12.0–46.0)
Lymphs Abs: 1.7 10*3/uL (ref 0.7–4.0)
MCHC: 34.5 g/dL (ref 30.0–36.0)
MCV: 96.8 fl (ref 78.0–100.0)
Monocytes Absolute: 0.4 10*3/uL (ref 0.1–1.0)
Monocytes Relative: 5.9 % (ref 3.0–12.0)
Neutro Abs: 3.9 10*3/uL (ref 1.4–7.7)
Neutrophils Relative %: 64.1 % (ref 43.0–77.0)
Platelets: 192 10*3/uL (ref 150.0–400.0)
RBC: 4.03 Mil/uL (ref 3.87–5.11)
RDW: 13.6 % (ref 11.5–15.5)
WBC: 6.1 10*3/uL (ref 4.0–10.5)

## 2021-01-02 ENCOUNTER — Telehealth: Payer: Self-pay

## 2021-01-02 DIAGNOSIS — D229 Melanocytic nevi, unspecified: Secondary | ICD-10-CM

## 2021-01-02 NOTE — Telephone Encounter (Signed)
Let her know I put in new derm referral. No referral needed for therapist. Can call and make appointment on own. Can also check with insurance to see if they cover certain counselors in area.   Orma Flaming, MD [place]

## 2021-01-02 NOTE — Telephone Encounter (Signed)
Pt called asking if Dr. Rogers Blocker could place a new referral for dermatology and for pt to go to a therapist. Please advise.

## 2021-01-04 NOTE — Telephone Encounter (Signed)
I spoke with the pt to give message below. Pt is already scheduled with Derm for 8/25. She requested a number for "preferred" counselor. Pt was given information for Trey Paula, but was advised that she will be retiring soon. Pt voiced understanding.

## 2021-02-03 ENCOUNTER — Ambulatory Visit (INDEPENDENT_AMBULATORY_CARE_PROVIDER_SITE_OTHER): Payer: BC Managed Care – PPO | Admitting: Psychologist

## 2021-02-03 DIAGNOSIS — F411 Generalized anxiety disorder: Secondary | ICD-10-CM

## 2021-02-08 ENCOUNTER — Ambulatory Visit: Payer: BC Managed Care – PPO | Admitting: Psychologist

## 2021-05-02 ENCOUNTER — Telehealth: Payer: Self-pay

## 2021-05-02 NOTE — Telephone Encounter (Signed)
LVM that Dr Rogers Blocker has left and to call the office back for a transfer of care appt

## 2021-06-05 ENCOUNTER — Encounter: Payer: BC Managed Care – PPO | Admitting: Family Medicine

## 2021-06-08 ENCOUNTER — Other Ambulatory Visit: Payer: Self-pay

## 2021-06-08 ENCOUNTER — Encounter: Payer: Self-pay | Admitting: Physician Assistant

## 2021-06-08 ENCOUNTER — Ambulatory Visit: Payer: BC Managed Care – PPO | Admitting: Physician Assistant

## 2021-06-08 DIAGNOSIS — Z1283 Encounter for screening for malignant neoplasm of skin: Secondary | ICD-10-CM | POA: Diagnosis not present

## 2021-06-08 DIAGNOSIS — L821 Other seborrheic keratosis: Secondary | ICD-10-CM | POA: Diagnosis not present

## 2021-06-08 DIAGNOSIS — Z808 Family history of malignant neoplasm of other organs or systems: Secondary | ICD-10-CM | POA: Diagnosis not present

## 2021-06-26 ENCOUNTER — Encounter: Payer: Self-pay | Admitting: Physician Assistant

## 2021-06-26 NOTE — Progress Notes (Signed)
   New Patient   Subjective  Adriana Deleon is a 40 y.o. female who presents for the following: Annual Exam (First skin check primary care wanted a dark mole checked on her back. Patient stated maternal grandpa had MM).   The following portions of the chart were reviewed this encounter and updated as appropriate:  Tobacco  Allergies  Meds  Problems  Med Hx  Surg Hx  Fam Hx      Objective  Well appearing patient in no apparent distress; mood and affect are within normal limits.  A full examination was performed including scalp, head, eyes, ears, nose, lips, neck, chest, axillae, abdomen, back, buttocks, bilateral upper extremities, bilateral lower extremities, hands, feet, fingers, toes, fingernails, and toenails. All findings within normal limits unless otherwise noted below.  Mid Back Stuck-on, waxy plaques.   Assessment & Plan  Seborrheic keratosis Mid Back  Observe. Return to clinic if it changes.    I, Shadow Schedler, PA-C, have reviewed all documentation's for this visit.  The documentation on 06/26/21 for the exam, diagnosis, procedures and orders are all accurate and complete.

## 2021-08-30 ENCOUNTER — Encounter: Payer: BC Managed Care – PPO | Admitting: Physician Assistant

## 2021-08-30 ENCOUNTER — Encounter: Payer: Self-pay | Admitting: Physician Assistant

## 2021-12-15 ENCOUNTER — Other Ambulatory Visit: Payer: Self-pay

## 2021-12-15 ENCOUNTER — Ambulatory Visit: Payer: BC Managed Care – PPO | Admitting: Family Medicine

## 2021-12-15 ENCOUNTER — Encounter: Payer: Self-pay | Admitting: Family Medicine

## 2021-12-15 VITALS — BP 112/64 | HR 68 | Temp 98.5°F | Ht 68.0 in | Wt 174.5 lb

## 2021-12-15 DIAGNOSIS — R509 Fever, unspecified: Secondary | ICD-10-CM

## 2021-12-15 DIAGNOSIS — R059 Cough, unspecified: Secondary | ICD-10-CM

## 2021-12-15 DIAGNOSIS — J029 Acute pharyngitis, unspecified: Secondary | ICD-10-CM | POA: Diagnosis not present

## 2021-12-15 MED ORDER — PENICILLIN V POTASSIUM 500 MG PO TABS: 500.0000 mg | ORAL_TABLET | Freq: Three times a day (TID) | ORAL | 0 refills | Status: AC

## 2021-12-15 NOTE — Progress Notes (Signed)
? ?  Subjective:  ? ? ? Patient ID: Adriana Deleon, female    DOB: 22-Jan-1981, 41 y.o.   MRN: [id number] ? ?Chief Complaint  ?Patient presents with  ? Sore Throat  ?  White spots in back of throat ?Sx started 2 days ago, got worse yesterday ?  ? Nasal Congestion  ? Cough  ?  Dry cough  ? Fever  ?  99.9 this morning and took 4 ibuprofen and 2 Dayquil ?Took Covid test yesterday, negative ?  ? Laryngitis  ? Headache  ? ? ?HPI ?Chief complaint: sore throat,congestion ?Symptom onset: 2 days ?Pertinent positives: sore throat, white spots on throat, congestion, cough dry, temp 99.9 this am, hoarseness, HA.  Had covid in Dec ?Pertinent negatives: no v/d.  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 ?Health Maintenance Due  ?Topic Date Due  ? COVID-19 Vaccine (3 - Moderna risk series) 02/09/2020  ? ? ?Past Medical History:  ?Diagnosis Date  ? Former smoker   ? GERD (gastroesophageal reflux disease)   ? due to pregnancy   ? ? ?Past Surgical History:  ?Procedure Laterality Date  ? CESAREAN SECTION  08/02/2015  ? CESAREAN SECTION MULTI-GESTATIONAL N/A 08/02/2015  ? Procedure: CESAREAN SECTION MULTI-GESTATIONAL;  Surgeon: Bobbye Charleston, MD;  Location: [facility];  Service: Obstetrics;  Laterality: N/A;  ? ? ?Outpatient Medications Prior to Visit  ?Medication Sig Dispense Refill  ? albuterol (VENTOLIN HFA) 108 (90 Base) MCG/ACT inhaler Inhale into the lungs.    ? mupirocin ointment (BACTROBAN) 2 % Apply 1 application topically 3 (three) times daily. 30 g 1  ? pantoprazole (PROTONIX) 40 MG tablet Take 1 tablet (40 mg total) by mouth daily. (Patient not taking: Reported on 06/08/2021) 30 tablet 3  ? ?No facility-administered medications prior to visit.  ? ? ?No Known Allergies ?ROS neg/noncontributory except as noted HPI/below ? ? ?   ?Objective:  ?  ? ?BP 112/64   Pulse 68   Temp 98.5 ?F (36.9 ?C) (Temporal)   Ht 5\' 8"  (1.727 m)   Wt 174 lb 8 oz (79.2 kg)    LMP 11/20/2021 (Exact Date)   SpO2 98%   BMI 26.53 kg/m?  ?Wt Readings from Last 3 Encounters:  ?12/15/21 174 lb 8 oz (79.2 kg)  ?06/03/20 216 lb 12.8 oz (98.3 kg)  ?04/20/20 223 lb 3.2 oz (101.2 kg)  ? ? ?   ? ?Gen: WDWN NAD WF.  hoarse ?HEENT: NCAT, conjunctiva not injected, sclera nonicteric ?TM WNL B, OP moist, red, +exudates  ?NECK:  supple, no thyromegaly, no nodes, no carotid bruits ?CARDIAC: RRR, S1S2+, no murmur. DP 2+B ?LUNGS: CTAB. No wheezes ?ABDOMEN:  BS+, soft, NTND, No HSM, no masses ?EXT:  no edema ?MSK: no gross abnormalities.  ?NEURO: A&O x3.  CN II-XII intact.  ?PSYCH: normal mood. Good eye contact ? ?Assessment & Plan:  ? ?Problem List Items Addressed This Visit   ?None ?Visit Diagnoses   ? ? Sore throat    -  Primary  ? Cough, unspecified type      ? Fever, unspecified fever cause      ? ?  ? Pharyngitis-doubt grp A strep but w/exudates, will tx.  Pcn.  Off work today ? ?No orders of the defined types were placed in this encounter. ? ? ?Wellington Hampshire, MD ? ?

## 2021-12-15 NOTE — Patient Instructions (Signed)
Meds have been sent the the pharmacy ?You can take tylenol/ibuprofen for pain/fevers ?If worsening symptoms, let us know or go to the Emergency room  ?

## 2021-12-19 ENCOUNTER — Telehealth: Payer: Self-pay

## 2021-12-19 NOTE — Telephone Encounter (Signed)
Patient stated she still has a low grade fever of 99.4- should she come back ? ?

## 2021-12-19 NOTE — Telephone Encounter (Signed)
Patient stated that she woke up with a low grade, lesions in back of throat have gone, some symptoms are still lingering, cough, lightheaded, nasal congestion and sore throat. Patient stated that she has been on penicillin since Friday, wanted to know if she needed to come back in to get something else or keep taking medication. Patient advised to complete medication, alternate between Tylenol and Ibuprofen for fever and give office a call about symptoms. Please advise.  ?

## 2022-05-08 ENCOUNTER — Ambulatory Visit: Payer: BC Managed Care – PPO | Admitting: Physician Assistant

## 2022-08-20 ENCOUNTER — Ambulatory Visit: Payer: BC Managed Care – PPO | Admitting: Physician Assistant

## 2022-08-20 NOTE — Progress Notes (Incomplete)
Adriana Deleon is a 41 y.o. female here for a new problem.  History of Present Illness:   No chief complaint on file.   HPI  ***  Past Medical History:  Diagnosis Date   Former smoker    GERD (gastroesophageal reflux disease)    due to pregnancy      Social History   Tobacco Use   Smoking status: Former    Packs/day: 0.25    Types: Cigarettes   Smokeless tobacco: Never  Vaping Use   Vaping Use: Never used  Substance Use Topics   Alcohol use: No   Drug use: No    Past Surgical History:  Procedure Laterality Date   CESAREAN SECTION  08/02/2015   CESAREAN SECTION MULTI-GESTATIONAL N/A 08/02/2015   Procedure: CESAREAN SECTION MULTI-GESTATIONAL;  Surgeon: Bobbye Charleston, MD;  Location: [facility];  Service: Obstetrics;  Laterality: N/A;    Family History  Problem Relation Age of Onset   Asthma Mother    Cancer Mother    COPD Mother    Depression Mother    Hyperlipidemia Mother    Hypertension Mother    Hyperlipidemia Father    Hypertension Father    Hyperlipidemia Brother    Hypertension Brother    Cancer Maternal Grandmother    Hyperlipidemia Maternal Grandmother    Hypertension Maternal Grandmother    Melanoma Maternal Grandfather    Cancer Maternal Grandfather    Diabetes Maternal Grandfather    Cancer Paternal Grandmother    Hypertension Paternal Grandmother    Hearing loss Paternal Grandmother    Arthritis Paternal Grandmother    Cancer Paternal Grandfather    Hypertension Paternal Grandfather    Hyperlipidemia Paternal Grandfather    Arthritis Paternal Grandfather     No Known Allergies  Current Medications:   Current Outpatient Medications:    albuterol (VENTOLIN HFA) 108 (90 Base) MCG/ACT inhaler, Inhale into the lungs., Disp: , Rfl:    mupirocin ointment (BACTROBAN) 2 %, Apply 1 application topically 3 (three) times daily., Disp: 30 g, Rfl: 1   Review of Systems:   ROS  Vitals:   There were no vitals filed for this visit.   There is  no height or weight on file to calculate BMI.  Physical Exam:   Physical Exam  Assessment and Plan:   ***   I,Alexis Herring,acting as a scribe for Inda Coke, PA.,have documented all relevant documentation on the behalf of Inda Coke, PA,as directed by  Inda Coke, PA while in the presence of Inda Coke, [place].  ***

## 2022-08-29 ENCOUNTER — Encounter: Payer: BC Managed Care – PPO | Admitting: Family Medicine

## 2022-09-05 ENCOUNTER — Encounter: Payer: Self-pay | Admitting: Family Medicine

## 2022-09-05 ENCOUNTER — Ambulatory Visit: Payer: BC Managed Care – PPO | Admitting: Family Medicine

## 2022-09-05 VITALS — BP 110/60 | HR 72 | Temp 98.2°F | Ht 68.0 in | Wt 173.0 lb

## 2022-09-05 DIAGNOSIS — M79604 Pain in right leg: Secondary | ICD-10-CM | POA: Diagnosis not present

## 2022-09-05 DIAGNOSIS — J069 Acute upper respiratory infection, unspecified: Secondary | ICD-10-CM | POA: Diagnosis not present

## 2022-09-05 NOTE — Patient Instructions (Signed)
It was very nice to see you today!  Happy Holidays  Stretches.   PLEASE NOTE:  If you had any lab tests please let us know if you have not heard back within a few days. You may see your results on MyChart before we have a chance to review them but we will give you a call once they are reviewed by [place]. If we ordered any referrals today, please let us know if you have not heard from their office within the next week.   Please try these tips to maintain a healthy lifestyle:  Eat most of your calories during the day when you are active. Eliminate processed foods including packaged sweets (pies, cakes, cookies), reduce intake of potatoes, white bread, white pasta, and white rice. Look for whole grain options, oat flour or almond flour.  Each meal should contain half fruits/vegetables, one quarter protein, and one quarter carbs (no bigger than a computer mouse).  Cut down on sweet beverages. This includes juice, soda, and sweet tea. Also watch fruit intake, though this is a healthier sweet option, it still contains natural sugar! Limit to 3 servings daily.  Drink at least 1 glass of water with each meal and aim for at least 8 glasses per day  Exercise at least 150 minutes every week.

## 2022-09-05 NOTE — Progress Notes (Signed)
Subjective:     Patient ID: Adriana Deleon, female    DOB: 1981-05-03, 41 y.o.   MRN: [id number]  Chief Complaint  Patient presents with   Transfer of Care    Transfer of Care due to pcp leaving practice Possible sinus infection Pulled muscle in right leg     HPI TOC-sinus infection  Some residual congestion/cough from 2 wks ago.  Getting better.  Woke up w/ITB pain for 3 days.  No specific injury.  Had been wearing heels. Exercises.  Was training for 1/2 marathon. Has puppies. Walked 2 miles sev days ago.  No n/t. Positional.  Ibu helps.  No LBP.  No weakness.    Health Maintenance Due  Topic Date Due   PAP SMEAR-Modifier  04/20/2022    Past Medical History:  Diagnosis Date   Former smoker    GERD (gastroesophageal reflux disease)    due to pregnancy     Past Surgical History:  Procedure Laterality Date   CESAREAN SECTION  08/02/2015   CESAREAN SECTION MULTI-GESTATIONAL N/A 08/02/2015   Procedure: CESAREAN SECTION MULTI-GESTATIONAL;  Surgeon: Bobbye Charleston, MD;  Location: [facility];  Service: Obstetrics;  Laterality: N/A;    Outpatient Medications Prior to Visit  Medication Sig Dispense Refill   mupirocin ointment (BACTROBAN) 2 % Apply 1 application topically 3 (three) times daily. 30 g 1   albuterol (VENTOLIN HFA) 108 (90 Base) MCG/ACT inhaler Inhale into the lungs. (Patient not taking: Reported on 09/05/2022)     No facility-administered medications prior to visit.    No Known Allergies ROS neg/noncontributory except as noted HPI/below Will do pap soon Occ cp-stress test in past. Jaw and back will hurt and "squeezing in chest".  Drinks "way too much caffeine".  Last-end of Oct while driving home from school.  Stops her, gets sob, etc.  Lasts 15-32mn.  Feels tired after.  Not while exercising.  Neck can hurt.  No HA.  Great gpa CAD.    In general-worries a lot. Only woke her 1-2 xx.  About  once every 2-318month    Objective:     BP 110/60   Pulse 72    Temp 98.2 F (36.8 C) (Temporal)   Ht '5\' 8"'$  (1.727 m)   Wt 173 lb (78.5 kg)   LMP 08/17/2022 (Exact Date)   SpO2 98%   BMI 26.30 kg/m  Wt Readings from Last 3 Encounters:  09/05/22 173 lb (78.5 kg)  12/15/21 174 lb 8 oz (79.2 kg)  06/03/20 216 lb 12.8 oz (98.3 kg)    Physical Exam   Gen: WDWN NAD HEENT: NCAT, conjunctiva not injected, sclera nonicteric TM WNL B, OP moist, no exudates  NECK:  supple, no thyromegaly, no nodes, no carotid bruits CARDIAC: RRR, S1S2+, no murmur. DP 2+B LUNGS: CTAB. No wheezes ABDOMEN:  BS+, soft, NTND, No HSM, no masses EXT:  no edema MSK: no gross abnormalities.  Some TTP ischeal tuberosity on R.  Some pain w/adduction and cross over of R leg.  No TTP thigh.  NEURO: A&O x3.  CN II-XII intact.  PSYCH: normal mood. Good eye contact     Assessment & Plan:   Problem List Items Addressed This Visit   None Visit Diagnoses     Upper respiratory tract infection, unspecified type    -  Primary   Pain of right lower extremity          URI-resolving.  No tx R leg pain-reassurance not DVT.  Either ITB  or some sciatica-stretches.   F/u annual in Jan  No orders of the defined types were placed in this encounter.   Wellington Hampshire, MD

## 2022-10-29 ENCOUNTER — Encounter: Payer: Self-pay | Admitting: Family Medicine

## 2022-10-29 ENCOUNTER — Ambulatory Visit (INDEPENDENT_AMBULATORY_CARE_PROVIDER_SITE_OTHER)
Admission: RE | Admit: 2022-10-29 | Discharge: 2022-10-29 | Disposition: A | Discharge status: Home / Self Care | Payer: BC Managed Care – PPO | Source: Ambulatory Visit | Attending: Family Medicine | Admitting: Family Medicine

## 2022-10-29 ENCOUNTER — Ambulatory Visit (INDEPENDENT_AMBULATORY_CARE_PROVIDER_SITE_OTHER): Payer: BC Managed Care – PPO | Admitting: Family Medicine

## 2022-10-29 VITALS — BP 90/60 | HR 67 | Temp 98.0°F | Ht 68.0 in | Wt 177.1 lb

## 2022-10-29 DIAGNOSIS — M79675 Pain in left toe(s): Secondary | ICD-10-CM | POA: Diagnosis not present

## 2022-10-29 DIAGNOSIS — Z0000 Encounter for general adult medical examination without abnormal findings: Secondary | ICD-10-CM | POA: Diagnosis not present

## 2022-10-29 LAB — COMPREHENSIVE METABOLIC PANEL
ALT: 13 U/L (ref 0–35)
AST: 15 U/L (ref 0–37)
Albumin: 4.3 g/dL (ref 3.5–5.2)
Alkaline Phosphatase: 36 U/L — ABNORMAL LOW (ref 39–117)
BUN: 14 mg/dL (ref 6–23)
CO2: 25 mEq/L (ref 19–32)
Calcium: 8.8 mg/dL (ref 8.4–10.5)
Chloride: 107 mEq/L (ref 96–112)
Creatinine, Ser: 0.64 mg/dL (ref 0.40–1.20)
GFR: 111.5 mL/min (ref >60.00)
Glucose, Bld: 88 mg/dL (ref 70–99)
Potassium: 3.9 mEq/L (ref 3.5–5.1)
Sodium: 140 mEq/L (ref 135–145)
Total Bilirubin: 0.9 mg/dL (ref 0.2–1.2)
Total Protein: 6.5 g/dL (ref 6.0–8.3)

## 2022-10-29 LAB — LIPID PANEL
Cholesterol: 156 mg/dL (ref 0–200)
HDL: 72.9 mg/dL (ref >39.00)
LDL Cholesterol: 70 mg/dL (ref 0–99)
NonHDL: 82.76
Total CHOL/HDL Ratio: 2
Triglycerides: 65 mg/dL (ref 0.0–149.0)
VLDL: 13 mg/dL (ref 0.0–40.0)

## 2022-10-29 LAB — CBC WITH DIFFERENTIAL/PLATELET
Basophils Absolute: 0 10*3/uL (ref 0.0–0.1)
Basophils Relative: 0.8 % (ref 0.0–3.0)
Eosinophils Absolute: 0 10*3/uL (ref 0.0–0.7)
Eosinophils Relative: 0.8 % (ref 0.0–5.0)
HCT: 38.5 % (ref 36.0–46.0)
Hemoglobin: 13.3 g/dL (ref 12.0–15.0)
Lymphocytes Relative: 30.9 % (ref 12.0–46.0)
Lymphs Abs: 1.2 10*3/uL (ref 0.7–4.0)
MCHC: 34.6 g/dL (ref 30.0–36.0)
MCV: 96.6 fl (ref 78.0–100.0)
Monocytes Absolute: 0.3 10*3/uL (ref 0.1–1.0)
Monocytes Relative: 8.2 % (ref 3.0–12.0)
Neutro Abs: 2.3 10*3/uL (ref 1.4–7.7)
Neutrophils Relative %: 59.3 % (ref 43.0–77.0)
Platelets: 163 10*3/uL (ref 150.0–400.0)
RBC: 3.99 Mil/uL (ref 3.87–5.11)
RDW: 13.1 % (ref 11.5–15.5)
WBC: 4 10*3/uL (ref 4.0–10.5)

## 2022-10-29 LAB — HEMOGLOBIN A1C: Hgb A1c MFr Bld: 5.1 % (ref 4.6–6.5)

## 2022-10-29 LAB — TSH: TSH: 0.68 u[IU]/mL (ref 0.35–5.50)

## 2022-10-29 NOTE — Progress Notes (Signed)
Phone [phone]   Subjective:   Patient is a 42 y.o. female presenting for annual physical.    Chief Complaint  Patient presents with   Annual Exam    CPE  Fasting Pap will be done at gyn    Annual-pap to be done w/gyn.  Exercises.  Saw Derm in summer.   See problem oriented charting- ROS- ROS: Gen: no fever, chills  Skin: no rash, itching ENT: no ear pain, ear drainage, nasal congestion, rhinorrhea, sinus pressure, sore throat Eyes: no blurry vision, double vision Resp: no cough, wheeze,SOB CV: no CP, palpitations, LE edema,  GI: no heartburn, n/v/d/c, abd pain GU: no dysuria, urgency, frequency, hematuria.  Menses reg.  Clotty menses.  MSK: no joint pain, myalgias, back pain.   L big toe-dropped TV on it.  Ok unless bends it.  Neuro: no dizziness, headache, weakness, vertigo Psych: no depression, anxiety, insomnia, SI   The following were reviewed and entered/updated in epic: Past Medical History:  Diagnosis Date   Former smoker    GERD (gastroesophageal reflux disease)    due to pregnancy    There are no problems to display for this patient.  Past Surgical History:  Procedure Laterality Date   CESAREAN SECTION  08/02/2015   CESAREAN SECTION MULTI-GESTATIONAL N/A 08/02/2015   Procedure: CESAREAN SECTION MULTI-GESTATIONAL;  Surgeon: Bobbye Charleston, MD;  Location: [facility];  Service: Obstetrics;  Laterality: N/A;    Family History  Problem Relation Age of Onset   Asthma Mother    Cancer Mother        small cell lung ca   COPD Mother    Depression Mother    Hyperlipidemia Mother    Hypertension Mother    Diabetes Father    Hyperlipidemia Father    Hypertension Father    Hyperlipidemia Brother    Hypertension Brother    Cancer Maternal Grandmother 63       breast   Hyperlipidemia Maternal Grandmother    Hypertension Maternal Grandmother    Melanoma Maternal Grandfather    Cancer Maternal Grandfather    Diabetes Maternal Grandfather    Hypertension  Paternal Grandmother    Hearing loss Paternal Grandmother    Arthritis Paternal Grandmother    Cancer Paternal Grandfather 64 - 74       leukemia   Hypertension Paternal Grandfather    Hyperlipidemia Paternal Grandfather    Arthritis Paternal Grandfather     Medications- reviewed and updated Current Outpatient Medications  Medication Sig Dispense Refill   mupirocin ointment (BACTROBAN) 2 % Apply 1 application topically 3 (three) times daily. 30 g 1   No current facility-administered medications for this visit.    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      Objective  Objective:  BP 90/60   Pulse 67   Temp 98 F (36.7 C) (Temporal)   Ht '5\' 8"'$  (1.727 m)   Wt 177 lb 2 oz (80.3 kg)   LMP 10/10/2022 (Exact Date)   SpO2 99%   BMI 26.93 kg/m  Physical Exam  Gen: WDWN NAD HEENT: NCAT, conjunctiva not injected, sclera nonicteric TM WNL B, OP moist, no exudates  NECK:  supple, no thyromegaly, no nodes, no carotid bruits CARDIAC: RRR, S1S2+, no murmur. DP 2+B LUNGS: CTAB. No wheezes ABDOMEN:  BS+, soft, NTND, No HSM, no masses EXT:  no edema MSK: no gross abnormalities. MS 5/5 all  4.  L great toe-small indent medial side on MTP.  Some TTP and also to dorsiflex NEURO: A&O x3.  CN II-XII intact.  PSYCH: normal mood. Good eye contact      Assessment and Plan   Health Maintenance counseling: 1. Anticipatory guidance: Patient counseled regarding regular dental exams q6 months, eye exams,  avoiding smoking and second hand smoke, limiting alcohol to 1 beverage per day, no illicit drugs.   2. Risk factor reduction:  Advised patient of need for regular exercise and diet rich and fruits and vegetables to reduce risk of heart attack and stroke. Exercise- +.  Wt Readings from Last 3 Encounters:  10/29/22 177 lb 2 oz (80.3 kg)  09/05/22 173 lb (78.5 kg)  12/15/21  174 lb 8 oz (79.2 kg)   3. Immunizations/screenings/ancillary studies Immunization History  Administered Date(s) Administered   Influenza,inj,Quad PF,6+ Mos 09/22/2019   MMR 05/15/2005   Moderna Sars-Covid-2 Vaccination 12/10/2019, 01/12/2020   Tdap 05/15/2008, 05/30/2015   There are no preventive care reminders to display for this patient.   4. Cervical cancer screening: gyn 5. Skin cancer screening- advised regular sunscreen use. Denies worrisome, changing, or new skin lesions.  6. Birth control/STD check: vasectomy 7. Smoking associated screening: non smoker 8. Alcohol screening: ok  Problem List Items Addressed This Visit   None Visit Diagnoses     Wellness examination    -  Primary   Relevant Orders   Comprehensive metabolic panel   Hemoglobin A1c   Lipid panel   TSH   CBC with Differential/Platelet      Wellness-anticipatory guidance.  Work on [employer].  Check CBC,CMP,lipids,TSH, A1C.  F/u 1 yr  L great toe pain for 2 wks after dropping TV.  Check X-ray-pt will be training for marathon  Recommended follow up: annualReturn in about 1 year (around 10/30/2023) for annual. No future appointments.  Lab/Order associations:+ fasting   ICD-10-CM   1. Wellness examination  Z00.00 Comprehensive metabolic panel    Hemoglobin A1c    Lipid panel    TSH    CBC with Differential/Platelet      No orders of the defined types were placed in this encounter.    Wellington Hampshire, MD

## 2022-10-29 NOTE — Patient Instructions (Addendum)
It was very nice to see you today!  Keep up the great work  get X-ray/labs at [facility].  [place]  hours 8=M-F 8:30-5.  closed 12:30-1 lunch    PLEASE NOTE:  If you had any lab tests please let us know if you have not heard back within a few days. You may see your results on MyChart before we have a chance to review them but we will give you a call once they are reviewed by [place]. If we ordered any referrals today, please let us know if you have not heard from their office within the next week.   Please try these tips to maintain a healthy lifestyle:  Eat most of your calories during the day when you are active. Eliminate processed foods including packaged sweets (pies, cakes, cookies), reduce intake of potatoes, white bread, white pasta, and white rice. Look for whole grain options, oat flour or almond flour.  Each meal should contain half fruits/vegetables, one quarter protein, and one quarter carbs (no bigger than a computer mouse).  Cut down on sweet beverages. This includes juice, soda, and sweet tea. Also watch fruit intake, though this is a healthier sweet option, it still contains natural sugar! Limit to 3 servings daily.  Drink at least 1 glass of water with each meal and aim for at least 8 glasses per day  Exercise at least 150 minutes every week.

## 2023-06-18 ENCOUNTER — Telehealth (INDEPENDENT_AMBULATORY_CARE_PROVIDER_SITE_OTHER): Payer: BC Managed Care – PPO | Admitting: Family Medicine

## 2023-06-18 ENCOUNTER — Encounter: Payer: Self-pay | Admitting: Family Medicine

## 2023-06-18 VITALS — Temp 100.6°F | Ht 68.0 in | Wt 188.0 lb

## 2023-06-18 DIAGNOSIS — U071 COVID-19: Secondary | ICD-10-CM

## 2023-06-18 MED ORDER — NIRMATRELVIR/RITONAVIR (PAXLOVID)TABLET: 3.0000 | ORAL_TABLET | Freq: Two times a day (BID) | ORAL | 0 refills | Status: AC

## 2023-06-18 NOTE — Progress Notes (Signed)
   Adriana Deleon is a 42 y.o. female who presents today for a virtual office visit.  Assessment/Plan:  COVID No red flags. Overall low risk but would be reasonable to treat with paxlovid regardless.  We will start today.  We did discuss other symptomatic care.  She has ipratropium at home that she can use.  Also discussed cough medication and antinausea medication however she declined.  Encouraged hydration.  She can use over-the-counter ibuprofen or Tylenol as needed.  We discussed reasons to return to care and seek emergent care.    Subjective:  HPI:  See Assessment / plan for status of chronic conditions. She is here today with COVID. Symptoms include cough congestion, body aches, headaches, diarrhea, nausea, and neck pain. Home test was positive yesterday. No known sick. 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 No shortness of breath. Cough is productive of yellow sputum.        Objective/Observations  Physical Exam: Gen: NAD, resting comfortably Pulm: Normal work of breathing Neuro: Grossly normal, moves all extremities Psych: Normal affect and thought content  Virtual Visit via Video   I connected with Adriana Deleon on 06/18/23 at 11:40 AM EDT by a video enabled telemedicine application and verified that I am speaking with the correct person using two identifiers. The limitations of evaluation and management by telemedicine and the availability of in person appointments were discussed. The patient expressed understanding and agreed to proceed.   Patient location: Home Provider location: [facility] Persons participating in the virtual visit: Myself and Patient     Katina Degree. Jimmey Ralph, MD 06/18/2023 11:42 AM

## 2023-10-31 ENCOUNTER — Encounter: Payer: Self-pay | Admitting: Family Medicine

## 2024-07-06 ENCOUNTER — Encounter: Payer: Self-pay | Admitting: Family Medicine

## 2024-07-06 ENCOUNTER — Ambulatory Visit: Admitting: Family Medicine

## 2024-07-06 ENCOUNTER — Ambulatory Visit: Payer: Self-pay

## 2024-07-06 VITALS — BP 104/72 | HR 75 | Temp 98.2°F | Resp 18 | Ht 68.0 in | Wt 198.1 lb

## 2024-07-06 DIAGNOSIS — J40 Bronchitis, not specified as acute or chronic: Secondary | ICD-10-CM | POA: Diagnosis not present

## 2024-07-06 DIAGNOSIS — U071 COVID-19: Secondary | ICD-10-CM

## 2024-07-06 MED ORDER — AZITHROMYCIN 250 MG PO TABS: ORAL_TABLET | ORAL | 0 refills | Status: AC

## 2024-07-06 NOTE — Progress Notes (Signed)
 Subjective:     Patient ID: Adriana Deleon, female    DOB: Dec 07, 1980, 43 y.o.   MRN: [id number]  Chief Complaint  Patient presents with   Cough    Productive cough with green mucus that started 3 or 4 days ago   Shortness of Breath    Covid positive on 06/24/24 Son dx with walking pneumonia    HPI Discussed the use of AI scribe software for clinical note transcription with the patient, who gave verbal consent to proceed.  History of Present Illness Adriana Deleon is a 43 year old female who presents with worsening respiratory symptoms following a COVID-19 diagnosis.  She was diagnosed with COVID-19 on June 24, 2024. Initially, her symptoms began to improve, but subsequently worsened. By Thursday, she experienced increased respiratory symptoms including sneezing, sore throat, and nasal congestion. Her symptoms are most severe in the morning and at night.  She describes persistent sneezing and itching of the eyes and nose, with nasal discharge that is green and yellow. She also reports a sore throat and significant coughing, which has disrupted her sleep, allowing for only about four hours of rest last night. The coughing is accompanied by 'bubbles of green' phlegm.  No fever is reported, but she has shortness of breath. She has an inhaler at home, which she has not used recently, and was previously offered prednisone and cough medicine, which she has not taken. She mentions having a small amount of cough medicine left at home.  Her back pain is noted, particularly when lying down. She mentions that her son was diagnosed with walking pneumonia two days before her COVID-19 diagnosis, raising concerns about the nature of her symptoms.  She experiences dizziness with deep breathing and has not had any recent surgeries or updates to her medical history.    Health Maintenance Due  Topic Date Due   Hepatitis B Vaccines 19-59 Average Risk (1 of 3 - 19+ 3-dose series) Never done   HPV  VACCINES (1 - 3-dose SCDM series) Never done   Cervical Cancer Screening (HPV/Pap Cotest)  Never done   Mammogram  Never done    Past Medical History:  Diagnosis Date   Former smoker    GERD (gastroesophageal reflux disease)    due to pregnancy     Past Surgical History:  Procedure Laterality Date   CESAREAN SECTION  08/02/2015   CESAREAN SECTION MULTI-GESTATIONAL N/A 08/02/2015   Procedure: CESAREAN SECTION MULTI-GESTATIONAL;  Surgeon: Rosaline Luna, MD;  Location: [facility];  Service: Obstetrics;  Laterality: N/A;     Current Outpatient Medications:    azithromycin  (ZITHROMAX ) 250 MG tablet, Take 2 tablets on day 1, then 1 tablet daily on days 2 through 5, Disp: 6 tablet, Rfl: 0   ibuprofen  (ADVIL ) 200 MG tablet, Take 200 mg by mouth every 6 (six) hours as needed., Disp: , Rfl:   No Known Allergies ROS neg/noncontributory except as noted HPI/below      Objective:     BP 104/72   Pulse 75   Temp 98.2 F (36.8 C) (Temporal)   Resp 18   Ht 5' 8 (1.727 m)   Wt 198 lb 2 oz (89.9 kg)   LMP 06/29/2024 (Exact Date)   SpO2 97%   BMI 30.12 kg/m  Wt Readings from Last 3 Encounters:  07/06/24 198 lb 2 oz (89.9 kg)  06/18/23 188 lb (85.3 kg)  10/29/22 177 lb 2 oz (80.3 kg)    Physical Exam   Gen: WDWN  NAD HEENT: NCAT, conjunctiva not injected, sclera nonicteric TM WNL B, OP moist, no exudates  NECK:  supple, no thyromegaly, few submand nodes CARDIAC: RRR, S1S2+, no murmur.  LUNGS: CTAB. No wheezes EXT:  no edema MSK: no gross abnormalities.  NEURO: A&O x3.  CN II-XII intact.  PSYCH: normal mood. Good eye contact     Assessment & Plan:  COVID-19  Bronchitis  Other orders -     Azithromycin ; Take 2 tablets on day 1, then 1 tablet daily on days 2 through 5  Dispense: 6 tablet; Refill: 0  Assessment and Plan Assessment & Plan COVID-19 infection   Diagnosed on June 24, 2024, with initial improvement followed by worsening symptoms: sneezing, sore throat,  nasal congestion, cough with green and yellow sputum, and back pain. No fever reported. Shortness of breath present. Differential diagnosis includes pneumonia/bronchitis.  No concerning findings on auscultation, but deep breathing caused dizziness. Prescribe Z-Pak (azithromycin ) for potential secondary bacterial infection. Advise use of existing cough syrup for symptomatic relief. Recommend use of inhaler for shortness of breath, ensuring the device is clean. Hasr prednisone  to be taken if desired. Instruct to contact the clinic if symptoms worsen or do not improve after completing the antibiotic course.  General Health Maintenance   Pap smear is due soon. Schedule Pap smear with GYN. Schedule annual physical examination within the next several months.    Return for annual physical-whenever.  Jenkins CHRISTELLA Carrel, MD

## 2024-07-06 NOTE — Telephone Encounter (Signed)
 FYI Only or Action Required?: FYI only for provider. Apt scheduled for today at 1:15 PM with PCP  Patient was last seen in primary care on 06/18/2023 by Kennyth Worth HERO, MD.  Called Nurse Triage reporting Cough.  Symptoms began several days ago.  Interventions attempted: Rest, hydration, or home remedies.  Symptoms are: unchanged.  Triage Disposition: See HCP Within 4 Hours (Or PCP Triage)  Patient/caregiver understands and will follow disposition?: Yes  Copied from CRM [id number]. Topic: Clinical - Red Word Triage >> Jul 06, 2024  8:37 AM Larissa RAMAN wrote: [facility] that prompted transfer to Nurse Triage: Chest pain, cough with discolored mucous Reason for Disposition  [1] MILD difficulty breathing (e.g., minimal/no SOB at rest, SOB with walking, pulse < 100) AND [2] still present when not coughing  Answer Assessment - Initial Assessment Questions Was diagnosed with COVID on 06/24/2024. Patient had a sore throat which went away. Developed cough 07/01/2024-scheduled for an acute visit today at 1:15 PM  1. ONSET: When did the cough begin?      Cough started 07/01/2024 2. SEVERITY: How bad is the cough today?      4 out of 10-cough is worse at night 3. SPUTUM: Describe the color of your sputum (e.g., none, dry cough; clear, white, yellow, green)     Yellow and green 4. HEMOPTYSIS: Are you coughing up any blood? If Yes, ask: How much? (e.g., flecks, streaks, tablespoons, etc.)     no 5. DIFFICULTY BREATHING: Are you having difficulty breathing? If Yes, ask: How bad is it? (e.g., mild, moderate, severe)      Mild-patient reports feeling winded at times 6. FEVER: Do you have a fever? If Yes, ask: What is your temperature, how was it measured, and when did it start?     no 7. CARDIAC HISTORY: Do you have any history of heart disease? (e.g., heart attack, congestive heart failure)      no 8. LUNG HISTORY: Do you have any history of lung disease?  (e.g., pulmonary embolus,  asthma, emphysema)     no 9. PE RISK FACTORS: Do you have a history of blood clots? (or: recent major surgery, recent prolonged travel, bedridden)     no 10. OTHER SYMPTOMS: Do you have any other symptoms? (e.g., runny nose, wheezing, chest pain)       Nasal congestion-yellow and green 12. TRAVEL: Have you traveled out of the country in the last month? (e.g., travel history, exposures)       no  Protocols used: Cough - Acute Productive-A-AH

## 2024-09-01 ENCOUNTER — Telehealth: Payer: Self-pay

## 2024-09-01 NOTE — Telephone Encounter (Signed)
 Please advise  Copied from CRM #[id number]. Topic: Clinical - Medical Advice >> Sep 01, 2024  4:01 PM Thersia BROCKS wrote: Reason for CRM: Patient stated she has symptoms of sinus infection , wanted to know if Dr. Wendolyn could prescribe zpack without being seen, or if she needs to be scheduled. Would like a callback regarding this

## 2024-09-02 ENCOUNTER — Encounter: Payer: Self-pay | Admitting: Family Medicine

## 2024-09-02 NOTE — Telephone Encounter (Signed)
 Called pt had to leave a message smk

## 2024-09-03 ENCOUNTER — Telehealth: Admitting: Family Medicine

## 2024-09-03 ENCOUNTER — Encounter: Payer: Self-pay | Admitting: Family Medicine

## 2024-09-03 VITALS — Wt 180.0 lb

## 2024-09-03 DIAGNOSIS — J0110 Acute frontal sinusitis, unspecified: Secondary | ICD-10-CM

## 2024-09-03 MED ORDER — AMOXICILLIN-POT CLAVULANATE 875-125 MG PO TABS: 1.0000 | ORAL_TABLET | Freq: Two times a day (BID) | ORAL | 0 refills | Status: AC

## 2024-09-03 NOTE — Progress Notes (Signed)
 Phone [phone] Virtual visit via Video note   Subjective:  Chief complaint: Chief Complaint  Patient presents with   Nasal Congestion    Blowing out green mucus and headache and head fullness since over over the weekend,  Starting coughing up green mucus today and chest feels heavier than normal and lost her voice today;     Our team/I connected with Rosina Louder at  4:00 PM EST by a video enabled telemedicine application (caregility through epic) and verified that I am speaking with the correct person using two identifiers. Our team/I discussed the limitations of evaluation and management by telemedicine and the availability of in person appointments.No physical exam was performed (except for noted visual exam or audio findings with Telehealth visits).   Location patient: [facility] Location provider: [facility], office Persons participating in the virtual visit:  patient  Past Medical History-  There are no active problems to display for this patient.  Medications- reviewed and updated Current Outpatient Medications  Medication Sig Dispense Refill   amoxicillin-clavulanate (AUGMENTIN) 875-125 MG tablet Take 1 tablet by mouth 2 (two) times daily for 7 days. Take if symptoms fail to improve by day 10, worsen, or you have fever at home without shortness of breath- if shortness of breath see us  14 tablet 0   ibuprofen  (ADVIL ) 200 MG tablet Take 200 mg by mouth every 6 (six) hours as needed.     No current facility-administered medications for this visit.     Objective:  Wt 180 lb (81.6 kg)   LMP 08/22/2024 (Exact Date)   BMI 27.37 kg/m  self reported vitals Gen: NAD, resting comfortably Frontal sinus tenderness when she presses.  Lungs: nonlabored, normal respiratory rate  Skin: appears dry, no obvious rash     Assessment and Plan   # Sinusitis S:symptom(s) started on Sunday. Woke up with nasal congestion and throat tickling as well as nasal fullness. Wakes up with  large volume of green sinus discharge. Coughing up balls of green sputum. Had COVID in September- had only felt well for 3 weeks. Some chest tightness and tickle but no labored breathing/shortness of breath. Has lost her voice. Drainage through day from nose but not as severe. Has had subjective fevers- has not formally checked. No chills. Some fatigue. No asthma or lung history. Frontal sinus tenderness noted. itchy ears.no wheeze. No body aches. Able to still work -ibuprofen  and sudafed helped yesterday -no improvement yet- only worsening is loss of voice -no home COVID/flu test A/P: Sinus infection/Sinusitis Viral based on <10 days, no double sickening, lack of severity of symptoms in first 3 days- green drainage plus fever (though we discussed with subjective fevers possible she has had fevers). Educated on signs that bacterial infection may have developed (symptoms over 10 days, double sickening, fever).  - we also discussed could do home COVID/flu test  Treatment: -symptomatic care with  Plain mucinex or mucinex- DM (if you want to have something to help with cough as well) Sinus rinses like a Neti Pot or Neilmed sinus rinse (make sure to follow instructions for water preparation Tylenol  650 mg every 6 hours to help with sinus pressure -Antibiotic indicated: - Not at present- but we did send antibiotic(s) in with appropriate indications to start treatment  Finally, we reviewed reasons to return to care including if symptoms worsen or persist  (despite above treatments) or new concerns arise (particularly fever or shortness of breath)    Recommended follow up:  No future appointments.  Lab/Order  associations:   ICD-10-CM   1. Acute non-recurrent frontal sinusitis  J01.10       Meds ordered this encounter  Medications   amoxicillin -clavulanate (AUGMENTIN ) 875-125 MG tablet    Sig: Take 1 tablet by mouth 2 (two) times daily for 7 days. Take if symptoms fail to improve by day 10,  worsen, or you have fever at home without shortness of breath- if shortness of breath see us     Dispense:  14 tablet    Refill:  0    Return precautions advised.  Garnette Lukes, MD

## 2024-09-03 NOTE — Patient Instructions (Addendum)
 Sinus infection/Sinusitis Viral based on <10 days, no double sickening, lack of severity of symptoms in first 3 days- green drainage plus fever (though we discussed with subjective fevers possible she has had fevers). Educated on signs that bacterial infection may have developed (symptoms over 10 days, double sickening, fever).   Treatment: -symptomatic care with  Plain mucinex or mucinex- DM (if you want to have something to help with cough as well) Sinus rinses like a Neti Pot or Neilmed sinus rinse (make sure to follow instructions for water preparation Tylenol  650 mg every 6 hours to help with sinus pressure -Antibiotic indicated: - Not at present- but we did send antibiotic(s) in with appropriate indications to start treatment  Finally, we reviewed reasons to return to care including if symptoms worsen or persist  (despite above treatments) or new concerns arise (particularly fever or shortness of breath)  Recommended follow up: Return for as needed for new, worsening, persistent symptoms.

## 2025-01-13 ENCOUNTER — Encounter: Admitting: Family Medicine
